# Patient Record
Sex: Female | Born: 1960 | Race: Black or African American | Hispanic: No | Marital: Single | Smoking: Never smoker
Health system: Southern US, Community
[De-identification: ages and names within clinical notes are randomized; demographics above are authoritative.]

## PROBLEM LIST (undated history)

## (undated) DIAGNOSIS — F419 Anxiety disorder, unspecified: Secondary | ICD-10-CM

## (undated) DIAGNOSIS — E7800 Pure hypercholesterolemia, unspecified: Secondary | ICD-10-CM

## (undated) DIAGNOSIS — Z87442 Personal history of urinary calculi: Secondary | ICD-10-CM

## (undated) DIAGNOSIS — F32A Depression, unspecified: Secondary | ICD-10-CM

## (undated) DIAGNOSIS — I10 Essential (primary) hypertension: Secondary | ICD-10-CM

## (undated) DIAGNOSIS — M1990 Unspecified osteoarthritis, unspecified site: Secondary | ICD-10-CM

## (undated) DIAGNOSIS — K219 Gastro-esophageal reflux disease without esophagitis: Secondary | ICD-10-CM

## (undated) DIAGNOSIS — F329 Major depressive disorder, single episode, unspecified: Secondary | ICD-10-CM

## (undated) HISTORY — DX: Gastro-esophageal reflux disease without esophagitis: K21.9

## (undated) HISTORY — DX: Depression, unspecified: F32.A

## (undated) HISTORY — DX: Anxiety disorder, unspecified: F41.9

## (undated) HISTORY — DX: Major depressive disorder, single episode, unspecified: F32.9

## (undated) HISTORY — PX: CARPAL TUNNEL RELEASE: SHX101

## (undated) HISTORY — PX: NECK SURGERY: SHX720

## (undated) HISTORY — DX: Unspecified osteoarthritis, unspecified site: M19.90

## (undated) HISTORY — PX: OTHER SURGICAL HISTORY: SHX169

## (undated) HISTORY — PX: EYE SURGERY: SHX253

## (undated) HISTORY — PX: ABDOMINAL HYSTERECTOMY: SHX81

---

## 2001-03-03 ENCOUNTER — Ambulatory Visit (HOSPITAL_COMMUNITY): Admission: RE | Admit: 2001-03-03 | Discharge: 2001-03-03 | Payer: Self-pay | Admitting: Orthopedic Surgery

## 2002-10-14 ENCOUNTER — Encounter: Admission: RE | Admit: 2002-10-14 | Discharge: 2003-01-12 | Payer: Self-pay

## 2003-02-24 ENCOUNTER — Encounter: Admission: RE | Admit: 2003-02-24 | Discharge: 2003-05-25 | Payer: Self-pay | Admitting: Anesthesiology

## 2004-01-03 ENCOUNTER — Ambulatory Visit (HOSPITAL_COMMUNITY): Admission: RE | Admit: 2004-01-03 | Discharge: 2004-01-03 | Payer: Self-pay | Admitting: Internal Medicine

## 2005-12-29 ENCOUNTER — Encounter: Admission: RE | Admit: 2005-12-29 | Discharge: 2005-12-29 | Payer: Self-pay | Admitting: Orthopaedic Surgery

## 2006-04-19 ENCOUNTER — Encounter
Admission: RE | Admit: 2006-04-19 | Discharge: 2006-07-18 | Payer: Self-pay | Admitting: Physical Medicine & Rehabilitation

## 2006-04-19 ENCOUNTER — Ambulatory Visit: Payer: Self-pay | Admitting: Physical Medicine & Rehabilitation

## 2006-05-20 ENCOUNTER — Ambulatory Visit: Payer: Self-pay | Admitting: Orthopedic Surgery

## 2006-05-21 ENCOUNTER — Ambulatory Visit: Payer: Self-pay | Admitting: Physical Medicine & Rehabilitation

## 2007-03-26 ENCOUNTER — Ambulatory Visit: Payer: Self-pay | Admitting: Orthopedic Surgery

## 2007-09-22 ENCOUNTER — Encounter: Admission: RE | Admit: 2007-09-22 | Discharge: 2007-09-22 | Payer: Self-pay | Admitting: Orthopaedic Surgery

## 2007-11-28 ENCOUNTER — Encounter: Admission: RE | Admit: 2007-11-28 | Discharge: 2007-11-28 | Payer: Self-pay | Admitting: Orthopaedic Surgery

## 2010-08-15 NOTE — Progress Notes (Addendum)
Summary: progress note  rogress note   Imported By: Jacklynn Ganong 05/29/2010 09:52:45  _____________________________________________________________________  External Attachment:    Type:   Image     Comment:   External Document

## 2010-08-15 NOTE — Progress Notes (Signed)
Summary: Initial evaluation  Initial evaluation   Imported By: Jacklynn Ganong 05/29/2010 09:49:35  _____________________________________________________________________  External Attachment:    Type:   Image     Comment:   External Document

## 2010-10-30 ENCOUNTER — Encounter: Payer: Self-pay | Admitting: Orthopedic Surgery

## 2010-11-03 ENCOUNTER — Encounter: Payer: Self-pay | Admitting: Orthopedic Surgery

## 2010-11-16 ENCOUNTER — Encounter: Payer: Self-pay | Admitting: Orthopedic Surgery

## 2010-11-16 ENCOUNTER — Ambulatory Visit (INDEPENDENT_AMBULATORY_CARE_PROVIDER_SITE_OTHER): Payer: Medicare Other | Admitting: Orthopedic Surgery

## 2010-11-16 VITALS — HR 70 | Resp 16 | Ht 63.0 in | Wt 149.0 lb

## 2010-11-16 DIAGNOSIS — M67439 Ganglion, unspecified wrist: Secondary | ICD-10-CM

## 2010-11-16 DIAGNOSIS — M6740 Ganglion, unspecified site: Secondary | ICD-10-CM

## 2010-11-16 NOTE — Progress Notes (Signed)
History  This is a 50 year old female, who presents with a dorsal wrist mass consistent with a ganglion with pain in the wrist joint and into the forearm. She noticed a ganglion after the pain started in her wrist. This radiates up into the distal portion of the forearm. The mass is flat seems to be multilobulated.  Her symptoms include sharp throbbing stabbing pain which is 6/10 it is constant and is associated with using her wrist her hand a lot.  Symptoms started some months ago but seemed to come on suddenly  She is ALLERGIC to Levaquin and Percocet  She's had a RIGHT carpal tunnel LEFT carpal tunnel release and a repeat LEFT carpal tunnel release.  She's had a cervical fusion.  She's had a hysterectomy.  She's had kidney stones 3 times.  She is a family history lung disease asthma and arthritis  She is single currently disabled.    Physical exam    General: The patient is normally developed, with normal grooming and hygiene. There are no gross deformities. The body habitus is normal   CDV: The pulse and perfusion of the extremities are normal   LYMPH: There is no gross lymphadenopathy in the extremities   Skin: There are no rashes, ulcers or cafe-au-lait spot   Psyche: The patient is alert, awake and oriented.  Mood is normal   Neuro:  The coordination and balance are normal.  Sensation is normal. Reflexes are 2+ and equal   Musculoskeletal  Left wrist mass flat, tender at the dorsal wrist joint and forearm. Wrist extension is 20 degrees The wrist is stable  There is weakness in flexion and extension    X-rays 3 views of the wrist joint:   Diagnosis: Dorsal wrist ganglion with wrist joint pain.  Recommend referral to a hand specialist, for excision/evaluation

## 2010-11-30 ENCOUNTER — Telehealth: Payer: Self-pay | Admitting: Radiology

## 2010-11-30 NOTE — Telephone Encounter (Signed)
I sent a referral for this patient to Dr. Amanda Pea to be seen for a ganglion cyst on her left wrist.

## 2010-12-01 NOTE — Assessment & Plan Note (Signed)
Amber Cunningham is back regarding her upper extremity pain, right slightly greater  than left.  We initiated Lyrica at last visit, titrating up to 75 mg t.i.d.  This seems to have helped somewhat, but she is still having stabbing,  tingling and aching pain in the hands and arms.  She also complains of  shoulder pain and also neck pain on the right.  She saw an orthopedic  surgeon, Dr. Romeo Apple, in [place], who felt that she would not benefit  from any further surgical intervention regarding her carpal tunnel problems.  She began a trial of Mobic 15 mg daily of which she is unsure if she is  seeing benefit.  The patient has tried some desensitization exercises at  home which she has been taught previously by therapy, and these seem to help  somewhat.  Sleep is poor.  Pain interferes with general activity, relations  with others and enjoyment of life at a moderate-to-severe level.  Pain  improves with rest, ice and somewhat heat and medications.  Generally,  activity worsens pain.   REVIEW OF SYSTEMS:  Positive for numbness, tingling, weakness, confusion,  depression, weight gain, constipation, abdominal pain and poor appetite.  The pertinent positives are listed above in full review in the health and  history section.   SOCIAL HISTORY:  The patient is single and is living with her son.   PHYSICAL EXAMINATION:  Blood pressure 118/52, pulse 93, respiratory rate 16,  oxygen saturation 98% on room air.  The patient is pleasant, alert and  oriented x3.  Affect is bright and appropriate.  Gait is stable.  Coordination in the upper extremities is decreased throughout the hands.  She continues to have tenderness in the left hand along the median nerve as  well as the radial in the sensory nerve distribution.  There is pain over  the operative site.  Right hand symptoms are similar except for the radial  nerve signs.  She had very preserved strength.  She had a positive  Finkelstein test.  Tinel test  was positive as was Phalen test bilaterally.  Strength is generally 4+ to 5/5 throughout both upper extremities.  She did  have right rotator cuff signs today as well as some tenderness in the  bicipital tendons.  Spurling's test was equivocal.  She did have some  tenderness in the upper trapezius muscle specifically today.  Cognitively,  she was appropriate.  Heart was regular rate.  Chest was clear.  Abdomen was  soft and nontender.   ASSESSMENT:  1. History of multiple carpal tunnel surgeries with left carpal tunnel      release approximately seven months ago.  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  There also appears to be radial sensory nerve      involvement in the left wrist.  Consider a complex regional pain      syndrome, type 2.  2. De Quervain's tenosynovitis.  3. Chronic pain of cervical spine.  4. Right rotator cuff syndrome.  5. Anxiety/depression disorder.  6. History of marijuana use.   PLAN:  1. Continue titrating Lyrica up, ultimately to 150 mg t.i.d.  2. Continue Mobic 15 mg daily.  3. After informed consent, we injected the right shoulder via posterior      approach with 3 cc 1% lidocaine and 40 mg Kenalog.  4. Urine drug  screen today was performed to assess for illegal drug use.      The patient is aware of the consequences of a positive test.  5. Consider revisiting therapy for the wrist, although the patient does      know some of her desensitization exercises already.  6. Add Pamelor 25 mg 1-2 nightly for sleep and neuropathic pain.  7. I will see the patient back in one month's time.      Ranelle Oyster, M.D.  Electronically Signed     ZTS/MedQ  D:  05/21/2006 15:49:09  T:  05/22/2006 08:46:03  Job #:  [id number]   cc:   Grayland Jack  Fax: [phone]

## 2010-12-01 NOTE — Consult Note (Signed)
NAME:  SHAQUANDRA, GALANO NO.:  [id number]   MEDICAL RECORD NO.:  [id number]                   PATIENT TYPE:  REC   LOCATION:                                       FACILITY:   PHYSICIAN:  Celene Kras, MD                     DATE OF BIRTH:  04/05/1961   DATE OF CONSULTATION:  05/11/2003  DATE OF DISCHARGE:                                   CONSULTATION   Yumi Insalaco [facility]. Evaluated her health  and history form and 14-point Review of Systems.   1. Lenea comes to [place] today and has had evaluation by [facility]  He has     considered her for surgery of the cervical spine and is also evaluating     her lumbar spine.  2. I reviewed the MRI of both cervical and lumbar; she has degenerative     components in lower lumbar segments, particularly 4-5 with a dark disk.     She has a facetal overlay.  Cervical MRI is not particularly evolved.  3. Lifestyle enhancements reviewed.  Home-based therapy discussed.  4. Consider TENS.  5. She also has incidental finding that has revealed some type of spot on     her liver.  This is being evaluated by surgery.    OBJECTIVE:  She has diffuse suprascapular paracervical and paralumbar  myofascial discomfort, impaired flexion and extension of the cervical and  lumbar spines.  Pain over posterior superior iliac spine, notable pain on  extension.  I really do not find any new neurological findings motor,  sensory, reflexes.   IMPRESSION:  Cervicalgia, degenerative spine disease, cervical spine,  cervical facet syndrome.   PLAN:  1. Will go ahead and wait to see her in followup.  I would like her to have     her liver cyst evaluated and further assessment by Dr. Tyrone Sage.  Dr.     Tyrone Sage is contemplating surgery.  Defer to him in this arena.  2. I would consider a lumbar epidural.  The cervical procedures have helped     her somewhat.  The rationale is to minimize escalation of  narcotic-based     pain medication.  3. She is using hydrocodone sparingly and appropriately,  and since august     has really only used shy of 100 tablets, and I reviewed this medication     with her.  I will go ahead and renew during the workup of her liver, and     further discission making as to controlled substance use will be made     based on a multimodality approach to treating her pain.  Possibly a     lumbar epidural would be very helpful here.   We will see her in followup.  Discussed with her.  She understands these  medications, overall plan.  No  barriers to communication.                                               Celene Kras, MD    HH/MEDQ  D:  05/11/2003  T:  05/11/2003  Job:  [id number]   cc:   Dr. Tyrone Sage  [facility]  [place]

## 2010-12-01 NOTE — Consult Note (Signed)
NAME:  Amber Cunningham, Amber Cunningham                         ACCOUNT NO.:  [id number]   MEDICAL RECORD NO.:  [id number]                   PATIENT TYPE:  REC   LOCATION:  [facility]                                  FACILITY:  [facility]   PHYSICIAN:  Zachary George, DO                      DATE OF BIRTH:  Mar 22, 1961   DATE OF CONSULTATION:  10/15/2002  DATE OF DISCHARGE:                                   CONSULTATION   Dear Dr. Mila Palmer:   Thank you very much for kindly referring this patient to the center for pain  and rehabilitative medicine for evaluation.  The patient was seen in our  clinic today.  Please refer to the following for details regarding the  History and Physical examination and treatment recommendations.  Once again,  thank you for allowing [place] to participate in the care of this patient.   CHIEF COMPLAINT:  Neck, shoulder, hands, and wrist pain.   HISTORY OF PRESENT ILLNESS:  The patient is a pleasant 50 year old, right-  hand dominant female who complains of a six-month history of pain in her  left anterior chest wall radiating to her left shoulder and neck and into  her upper back bilaterally with pain in her forearms with associated  numbness and paresthesias. She has a history of bilateral carpal tunnel  syndrome for greater than one year and is status post right carpal tunnel  release x 2.  Her workup today has included cardiac workup which, according  to her report, is negative.  I do not have records in this regard.   She has had an MRI of her cervical spine on 10/23/2001 which revealed  multilevel spondylosis with leftward foraminal narrowing at C5-6 and C6-7 as  well as C7-T1.  There is mild central canal stenosis at C5-6 secondary to  spondylosis.  There is no lateralizing cervical disk protrusion or extrusion  noted.  She has been evaluated by a neurosurgeon at [facility] in  [place] who did not feel like she was a surgical candidate and felt  like her pain was coming from  her left shoulder.  The patient has undergone  left shoulder injection without any relief.  She has not had any physical  therapy for her current symptoms.  She has undergone nerve conduction  studies and EMG on two separate occasions revealing left carpal tunnel  syndrome and resolved right carpal tunnel syndrome postoperatively.   Her pain is rated as 5/10 on subjective scale and described as achy,  throbbing, burning with associated numbness, tingling, and weakness.  Her  symptoms are worse with working and improved with medications to some  degree.  She currently takes Relafen 750 mg 1 p.o. b.i.d. which she states  helps a little. She also takes Talwin 1 every 4 hours as needed.  She takes  this very sparingly.  In fact, she brings her  bottles of pills with her  today and has 8 pills out of 60 remaining from a prescription written on  09/08/2002.  She really does not like taking medications.  She did have some  blood work checked at some point but cannot remember exactly what it was,  and I do not have results or records in this regard.   I reviewed the health and history form and 14-point Review of Systems.  Function and quality of life indices have declined.  Her sleep has been  poor, but she states it is better now after starting to take Clonazepam  regularly every evening which has been prescribed for panic disorder and  depression.  She also takes fluoxetine.  The patient denies any fevers,  chills, night sweats, or weight loss.  Denies any bowel or bladder  dysfunction.  She is treated by mental health for her depression and  anxiety.   PAST MEDICAL HISTORY:  1. Kidney stones.  2. Panic attack.  3. Depression.   PAST SURGICAL HISTORY:  1. Carpal tunnel surgery August 2002 and November 2002.  2. Exploratory laparoscopy in 2002 in left lower quadrant.  3. Tubal ligation.   FAMILY HISTORY:  Noncontributory.   SOCIAL HISTORY:  The patient denies smoking or alcohol use.  She  states she  used marijuana approximately one month ago but denies regular use.  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   ALLERGIES:  No known drug allergies.   MEDICATIONS:  Clonazepam, fluoxetine, Relafen, and Talwin.   PHYSICAL EXAMINATION:  GENERAL:  Healthy-appearing female in no acute  distress.  Mood and affect are appropriate.  The patient is alert and  oriented.  VITAL SIGNS:  Blood pressure 121/57, pulse 82, respirations 16, O2  saturation 99% on room air.  HEART:  Regular rate and rhythm without gallops, murmurs, or rubs.  LUNGS:  Clear to auscultation bilaterally.  EXTREMITIES:  There is no heat, erythema, or edema in the upper and lower  extremities.  MUSCULOSKELETAL:  Flattened thoracic kyphosis with normal cervical lordosis.  She has full range of motion of the cervical spine with a pulling sensation  in her left upper back with right side bending.  She has full range of  motion of the shoulders bilaterally.  Palpatory examination reveals trigger  points in the left upper trapezius, levator, scapulae, infraspinatus,  suboccipital muscles, and pectoralis muscles on the left.  She also has  significant tenderness to palpation over the left biceps tendon.  Provocative maneuvers of the shoulder reveal negative Hawkins, Neer, empty  can, and O'Brien tests bilaterally, but these maneuvers tend to increase  patient's periscapular and upper trapezius discomfort.  NEUROLOGIC:  Manual muscle testing is 5/5 bilateral upper and lower  extremities.  Sensory examination is intact to light touch bilateral upper  and lower extremities at this time.  Muscle stretch reflexes are 2+/4  bilateral biceps, triceps, brachial radialis, pronator teres, patellar, hamstrings, and Achilles.  Tinel's test is positive bilaterally at the  wrists over the median nerves,  right greater than left.  Spurling maneuver  is negative bilaterally.   IMPRESSION:  1. Myofascial pain syndrome with above-noted trigger points.  2. Cervicalgia, multifactorial with myofascial component as well as cervical     spondylosis which may be contributing as well.  3. Left biceps tendinitis.  4.  History of depression.   PLAN:  1. Discussed treatment options with the patient.  I would like to get her     started into physical therapy for range of motion, stretching, cervical     and scapular stabilization exercises, as well as general cervical     traction and modalities as needed home exercise program three times per     week for four weeks.  2. Will start Lidoderm 5% apply up to 12 hours per day, maximum 3 patches at     a time, #60 without refills.  She may cut the Lidoderm and use a small     strip across her wrists if desired.  3. Continue Relafen for now.  4. Consider Ultram for primary pain control.  Would use this with some     caution secondary to being on an SSRI medication as there may be     potential interaction and lowering of seizure threshold, although the     patient does not have a history of seizures, and I feel this would be     very rare.  5. The patient is to follow up with Dr. Mila Palmer.  6. The patient will return to clinic on an as-needed basis.  If symptoms are     not improving, would consider trigger point injections.  If she got no     relief with the trigger point injections, would consider cervical facet     injections versus cervical epidural injections.   The patient was educated about findings and recommendations and understands.  No barriers to communication.                                                Zachary George, DO    JW/MEDQ  D:  10/15/2002  T:  10/16/2002  Job:  [id number]   cc:   Janean Sark, M.D.  [address]  [place], [place] [address]

## 2010-12-01 NOTE — Consult Note (Signed)
   NAME:  Amber Cunningham, Amber Cunningham NO.:  [id number]   MEDICAL RECORD NO.:  [id number]                   PATIENT TYPE:  REC   LOCATION:                                       FACILITY:  [facility]   PHYSICIAN:  Celene Kras, MD                     DATE OF BIRTH:  [birth year]   DATE OF CONSULTATION:  03/02/2003  DATE OF DISCHARGE:                                   CONSULTATION   Amber Cunningham comes to pain management center today for evaluation.  I  reviewed her history form and 14-point Review of Systems.   1. Amber Cunningham comes in today with progressive and problematic cervicalgia.  I     really do not see anything changing neurologically, but the cervical     epidurals only offered about two to three weeks relief, and she is still     impaired with range of motion.  2. I do not think further imaging or diagnostics are warranted.  I do,     however, think she needs a neurosurgical assessment.  I will send her to     Dr. Tyrone Sage for this.  3. I am going to increase her analgesic capacity.  I also cautioned her     about mixing Diazepam and Klonopin, and she understands.  She will stick     with Klonopin.  4. We discussed home-based therapy which will enhance overall outcome.  5. If Dr. Tyrone Sage does not take a surgical position, would contemplate moving     forward with facetal injection as she lateralizes her symptoms, with     option as to radiofrequency neural ablation with positive provacative     block.   Objectively, diffuse paracervical myofascia discomfort, impaired flexion,  extension, and lateral rotation.  Pain positive cervical facetal compression  test, right greater than left.  Suprascapular levator scapula myofascial  pain as well.  She has no new neurological findings.  Normal sensory  function.   IMPRESSION:  1. Cervicalgia.  2. Degenerative spinal disease of cervical spine.  3. Cervical facet syndrome.   PLAN:  1. Follow up in three to four weeks after  she has an opportunity to be     reviewed by Dr. Tyrone Sage .  2. Consider further interventional procedures as we move down our decision     tree.  3. Will go ahead and prescribe hydrocodone analgesic capacity with full     disclosure.  Discharge instructions given.                                               Celene Kras, MD    HH/MEDQ  D:  03/02/2003  T:  03/02/2003  Job:  [id number]

## 2010-12-01 NOTE — Op Note (Signed)
   NAME:  Amber Cunningham, Amber Cunningham NO.:  [id number]   MEDICAL RECORD NO.:  [id number]                   PATIENT TYPE:  REC   LOCATION:  [facility]                                  FACILITY:  [facility]   PHYSICIAN:  Celene Kras, MD                     DATE OF BIRTH:  09-23-1960   DATE OF PROCEDURE:  01/05/2003  DATE OF DISCHARGE:                                 OPERATIVE REPORT   The patient comes into pain management today to evaluate review health and  history form and 14-point review of systems.   Originally scheduled for a cervical facetal injection. I think we have seen  significant benefit with cervical epidural. I think a second one is  warranted.  Then I would like to follow her up and see how she is doing. I  do not necessarily plan another injection, but I would like to see her in  follow-up, and I reviewed risks, complications, and options of this  procedure.   I do not believe further imaging or diagnostic are warranted.   Lifestyle enhancements were reviewed.   Review of medications, nothing is added here today.   Her symptoms have primarily migrated to a myofascial position, suprascapular  and levator scapular positioning.  He does have a facetal overlay and I  would consider doing a facet injection with possible neurotomy for prolonged  or recycling, but I think we are seeing the most benefit from a broad  brushstroke perspective with the cervical epidural.   OBJECTIVE:  Diffuse paracervical myofascial discomfort, impaired flexion,  extension, and lateral rotational pain, positive cervical facetal  compression test right greater than left. She has no new neurological  findings.  Motor, sensory, or reflexes.   IMPRESSION:  Cervicalgia, degenerative spine disease of the cervical spine.   PLAN:  Cervical epidural. She has consented.   DESCRIPTION OF PROCEDURE:  The patient was taken to the fluoroscopy suite  and placed in the upright position. The neck  was prepped and draped in the  usual sterile fashion.  Using a Hustead needle, I advanced to the C7-T1  interspace without any evidence of CSF, heme, or paresthesias.  Test block  uneventfully followed with 40 mg of Versed to flush the needle.   Tolerated the procedure well with no complications to our procedure.  Discharge instructions given.  Appropriate recovery.  No complications  identified.                                               Celene Kras, MD    HH/MEDQ  D:  01/05/2003  T:  01/06/2003  Job:  [id number]

## 2010-12-01 NOTE — Op Note (Signed)
   NAME:  Amber Cunningham, HORNUNG NO.:  [id number]   MEDICAL RECORD NO.:  [id number]                   PATIENT TYPE:  REC   LOCATION:  [facility]                                  FACILITY:  [facility]   PHYSICIAN:  Celene Kras, MD                     DATE OF BIRTH:  1961/05/21   DATE OF PROCEDURE:  DATE OF DISCHARGE:                                 OPERATIVE REPORT   CLINICAL NOTE:  [facility]  I  evaluated her via health and history form and 14-point review of systems.   1. I reviewed the chart, reviewed available imaging.  I reviewed the MRI and     request from Dr. Regino Schultze.  We are going to go ahead and proceed today     with cervical epidural.  She had a modest result with minimally-invasive     approach, trigger point injection, conservative management, and is still     remaining out of work.  She is contemplating disability.  I encouraged     her to remain enabled, and rationale for performing the injection is to     minimize escalation of narcotic-based pain medication and further support     enabling behavior.  2. Do not believe further imaging or diagnostics are warranted.  3. Review of medications, she is appropriate.  Review of home-based therapy.   OBJECTIVE:  Diffuse paracervical myofascial discomfort.  Impaired flexion  and extension, lateral rotational pain.  Positive cervical facetal  compression test, right greater than left.  She has no new neurological  findings motor, sensory, or reflexes.   IMPRESSION:  1. Cervicalgia.  2. Degenerative spine disease of the cervical spine.   PLAN:  Cervical epidural.  She is consented.   DESCRIPTION OF PROCEDURE:  The patient is taken to the procedure area,  placed in the upright position, appropriate IV access and monitors were  placed.  Versed 2 mg sedation, appropriate recovery.   The patient is prepped and draped in the usual fashion.  Using a Hustead  needle under  local anesthetic, I advanced to the C7-T1 interspace without  any evidence of CSF, heme, or paresthesia.  I used hanging drop test  technique.  I then test block uneventfully and follow with 40 mg of  Aristocort and flush the needle.   She tolerated this procedure well, no complication from our procedure.  Discharge instructions given.  Will see her in follow-up.                                               Celene Kras, MD    HH/MEDQ  D:  12/15/2002  T:  12/15/2002  Job:  [id number]

## 2010-12-01 NOTE — Group Therapy Note (Signed)
CHIEF COMPLAINT:  Bilateral hand and wrist pain.   HISTORY OF PRESENT ILLNESS:  This is a 50 year old African American female  who was in our office three or four years ago for cervical pain.  She is  presenting today for bilateral hand pain.  She has had problems with carpal  tunnel syndrome in both upper extremities.  She had her most recent carpal  tunnel release in March of 2007 in the left with a dorsal lateral release as  well with deQuervain's tenosynovitis.  Since the surgery, her left hand has  been worse and in fact worse on the right hand.  She has had three release  type surgeries on the right wrist and she does not recall how long ago that  last right surgery was.  Perhaps it was a year to a year and a half ago.  She has had nerve conduction studies in the past which concurred with carpal  tunnel syndrome.  None of those are available to me.  It seems to be a few  years ago when they were done.  The patient states that her hands and wrists  were most painful during the day when she is active and lifting.  She has  problems with folding clothes and driving.  She also has pain at night.  She  gets about four hours of sleep.  She had been on Lyrica 75 mg q.h.s. which  has helped somewhat but she stopped when her prescription ran out.  She  noted some weight gain with this.  Pain is described as tingling, constant,  aching and sharp.  She notes some occasional swelling in the wrist as well  as temperature change and color change, particularly with activity.  Dorsally on the right and left hand, the pain seems to cover the whole palm  and wrist.  She has more central wrist pain on the left.  On the right side,  the pain is more diffuse, particularly at the dorsal aspect.  The volar  aspect seems to be more related to the surgical site as well.  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  She has not been  back at work since then.  She is not taking much in  the way of pain and just  using PM at nighttime to help her sleep.   REVIEW OF SYSTEMS:  Positive for numbness, confusion, depression, anxiety,  weakness, weight gain, diarrhea, constipation, poor appetite, limb swelling.  Full review as in the health and history section and other pertinent  positives listed above.  The patient has been seen by Dr. Noel Gerold for neck and  cervical pain and has had prior neck surgery in the past.   PAST MEDICAL HISTORY:  1. Positive for diverticulitis.  2. Arthritis as mentioned above.  3. Carpal tunnel syndrome.  4. Depression/anxiety.  5. Nephrolithiasis.  6. Hysterectomy in 2001.  7. She had kidney stones eradicated on three separate occasions.  8. Neck surgery with fixation in 2001 by Dr. Tyrone Sage.   SOCIAL HISTORY:  The patient is single, living with her son.  She has used  marijuana within the last few days but states that she only uses it rarely.   PHYSICAL EXAMINATION:  Blood pressure 116/57, pulse is 79, respiratory rate  is 16.  She is saturating 97% on room air.  The patient is generally  pleasant, in no acute distress.  She is a bit anxious.  Gait is stable.  Coordination was fair.  Reflexes were 1+ in the upper extremities.  Sensation was decreased to fine touch and pinprick over both hands,  particularly in the median nerve distribution.  There seemed to be a radial  nerve component in both extremities today.  There may have been some ulnar  involvement on the right side, although this was inconsistent.  The patient  had intact finger movement.  She had a tight grip with OK maneuver.  Her  Finkelstein's tests was positive on both hands.  The Tinel's test was  positive as was Phalen's test in both hands today.  She had 4/5 strength at  the elbows with intact range of motion.  Shoulder strength was 5/5.  Cognitively, she was appropriate.  Cranial nerve exam was intact.  Lower  extremity exam was 5/5 for strength with good coordination and  mobility.  Reflexes 2+ in the legs.  The patient was a bit anxious today but generally  appropriate.   On examination of the skin, the patient's surgical scars were noted.  She  had significant pain and tenderness over the dorsal with scar on the left.  Both median/carpal tunnel operative sites were tender with palpation today.  I would say that her left wrist was almost allodynic.  No temperature  changes were appreciated or color changes in the skin today.  Pulses were 2+  in both limbs.   ASSESSMENT:  1. History of multiple carpal tunnel surgeries with left carpal tunnel      release six months ago.  The patient with residual neuropathy.  Could      be developing complex regional pain syndrome, type 2.  Cannot rule out      neuroma.  2. DeQuervain's tenosynovitis.  3. Chronic pain predominantly in the cervical spine.  4. Anxiety/depression disorder.   PLAN:  1. As the patient had done pretty well with the Lyrica.  We will      reinitiate Lyrica at 75 mg q.h.s. increasing to t.i.d. over 10 days      time.  2. Begin Mobic trial 15 mg daily.  3. Need to get patient back into therapy to work on desensitization      exercises.  4. Might need to follow up nerve conduction studies.  5. The patient will have a urine drug screen performed at next visit.  She      is warned that if this test comes up positive for marijuana she will be      discharged from the clinic.  6. Consider triple phase bone scan.  7. We will see the patient back in one months' time.      Ranelle Oyster, M.D.  Electronically Signed     ZTS[facility]  D:  04/22/2006 16:21:25  T:  04/24/2006 00:37:18  Job #:  [id number]   cc:   [place] Jack  Fax: [phone]

## 2014-07-14 ENCOUNTER — Encounter (INDEPENDENT_AMBULATORY_CARE_PROVIDER_SITE_OTHER): Payer: Self-pay

## 2014-07-14 ENCOUNTER — Encounter (INDEPENDENT_AMBULATORY_CARE_PROVIDER_SITE_OTHER): Payer: Self-pay | Admitting: *Deleted

## 2014-08-03 ENCOUNTER — Ambulatory Visit: Payer: Medicare Other | Admitting: Orthopedic Surgery

## 2014-08-12 ENCOUNTER — Ambulatory Visit: Payer: Medicare Other | Admitting: Orthopedic Surgery

## 2014-08-25 ENCOUNTER — Encounter: Payer: Self-pay | Admitting: Orthopedic Surgery

## 2015-07-19 DIAGNOSIS — M79642 Pain in left hand: Secondary | ICD-10-CM | POA: Diagnosis not present

## 2015-07-19 DIAGNOSIS — M62542 Muscle wasting and atrophy, not elsewhere classified, left hand: Secondary | ICD-10-CM | POA: Diagnosis not present

## 2015-07-19 DIAGNOSIS — M25442 Effusion, left hand: Secondary | ICD-10-CM | POA: Diagnosis not present

## 2015-07-19 DIAGNOSIS — M25642 Stiffness of left hand, not elsewhere classified: Secondary | ICD-10-CM | POA: Diagnosis not present

## 2015-07-20 DIAGNOSIS — M65842 Other synovitis and tenosynovitis, left hand: Secondary | ICD-10-CM | POA: Diagnosis not present

## 2015-07-20 DIAGNOSIS — M67432 Ganglion, left wrist: Secondary | ICD-10-CM | POA: Diagnosis not present

## 2015-07-20 DIAGNOSIS — M25532 Pain in left wrist: Secondary | ICD-10-CM | POA: Diagnosis not present

## 2015-07-20 DIAGNOSIS — Z4789 Encounter for other orthopedic aftercare: Secondary | ICD-10-CM | POA: Diagnosis not present

## 2015-09-05 DIAGNOSIS — M65842 Other synovitis and tenosynovitis, left hand: Secondary | ICD-10-CM | POA: Diagnosis not present

## 2015-09-05 DIAGNOSIS — M1811 Unilateral primary osteoarthritis of first carpometacarpal joint, right hand: Secondary | ICD-10-CM | POA: Diagnosis not present

## 2015-09-05 DIAGNOSIS — M67432 Ganglion, left wrist: Secondary | ICD-10-CM | POA: Diagnosis not present

## 2015-09-05 DIAGNOSIS — M25532 Pain in left wrist: Secondary | ICD-10-CM | POA: Diagnosis not present

## 2015-10-05 DIAGNOSIS — F331 Major depressive disorder, recurrent, moderate: Secondary | ICD-10-CM | POA: Diagnosis not present

## 2015-10-11 DIAGNOSIS — E2839 Other primary ovarian failure: Secondary | ICD-10-CM | POA: Diagnosis not present

## 2015-12-06 DIAGNOSIS — F331 Major depressive disorder, recurrent, moderate: Secondary | ICD-10-CM | POA: Diagnosis not present

## 2015-12-09 DIAGNOSIS — M25561 Pain in right knee: Secondary | ICD-10-CM | POA: Diagnosis not present

## 2016-02-28 DIAGNOSIS — F331 Major depressive disorder, recurrent, moderate: Secondary | ICD-10-CM | POA: Diagnosis not present

## 2016-03-28 DIAGNOSIS — F331 Major depressive disorder, recurrent, moderate: Secondary | ICD-10-CM | POA: Diagnosis not present

## 2016-05-18 DIAGNOSIS — Z23 Encounter for immunization: Secondary | ICD-10-CM | POA: Diagnosis not present

## 2016-06-12 DIAGNOSIS — Z299 Encounter for prophylactic measures, unspecified: Secondary | ICD-10-CM | POA: Diagnosis not present

## 2016-06-12 DIAGNOSIS — E7800 Pure hypercholesterolemia, unspecified: Secondary | ICD-10-CM | POA: Diagnosis not present

## 2016-06-12 DIAGNOSIS — Z79899 Other long term (current) drug therapy: Secondary | ICD-10-CM | POA: Diagnosis not present

## 2016-06-12 DIAGNOSIS — F419 Anxiety disorder, unspecified: Secondary | ICD-10-CM | POA: Diagnosis not present

## 2016-06-12 DIAGNOSIS — Z7189 Other specified counseling: Secondary | ICD-10-CM | POA: Diagnosis not present

## 2016-06-12 DIAGNOSIS — Z1389 Encounter for screening for other disorder: Secondary | ICD-10-CM | POA: Diagnosis not present

## 2016-06-12 DIAGNOSIS — Z0000 Encounter for general adult medical examination without abnormal findings: Secondary | ICD-10-CM | POA: Diagnosis not present

## 2016-06-12 DIAGNOSIS — Z1211 Encounter for screening for malignant neoplasm of colon: Secondary | ICD-10-CM | POA: Diagnosis not present

## 2016-06-12 DIAGNOSIS — Z6823 Body mass index (BMI) 23.0-23.9, adult: Secondary | ICD-10-CM | POA: Diagnosis not present

## 2016-06-27 DIAGNOSIS — F419 Anxiety disorder, unspecified: Secondary | ICD-10-CM | POA: Diagnosis not present

## 2016-07-18 DIAGNOSIS — Z713 Dietary counseling and surveillance: Secondary | ICD-10-CM | POA: Diagnosis not present

## 2016-07-18 DIAGNOSIS — Z6823 Body mass index (BMI) 23.0-23.9, adult: Secondary | ICD-10-CM | POA: Diagnosis not present

## 2016-07-18 DIAGNOSIS — Z299 Encounter for prophylactic measures, unspecified: Secondary | ICD-10-CM | POA: Diagnosis not present

## 2016-07-18 DIAGNOSIS — Z789 Other specified health status: Secondary | ICD-10-CM | POA: Diagnosis not present

## 2016-07-18 DIAGNOSIS — J069 Acute upper respiratory infection, unspecified: Secondary | ICD-10-CM | POA: Diagnosis not present

## 2016-09-17 DIAGNOSIS — F331 Major depressive disorder, recurrent, moderate: Secondary | ICD-10-CM | POA: Diagnosis not present

## 2016-12-03 DIAGNOSIS — E7800 Pure hypercholesterolemia, unspecified: Secondary | ICD-10-CM | POA: Diagnosis not present

## 2016-12-03 DIAGNOSIS — Z299 Encounter for prophylactic measures, unspecified: Secondary | ICD-10-CM | POA: Diagnosis not present

## 2016-12-03 DIAGNOSIS — Z6823 Body mass index (BMI) 23.0-23.9, adult: Secondary | ICD-10-CM | POA: Diagnosis not present

## 2016-12-03 DIAGNOSIS — Z713 Dietary counseling and surveillance: Secondary | ICD-10-CM | POA: Diagnosis not present

## 2016-12-03 DIAGNOSIS — M25561 Pain in right knee: Secondary | ICD-10-CM | POA: Diagnosis not present

## 2016-12-04 DIAGNOSIS — M25561 Pain in right knee: Secondary | ICD-10-CM | POA: Diagnosis not present

## 2016-12-06 DIAGNOSIS — F331 Major depressive disorder, recurrent, moderate: Secondary | ICD-10-CM | POA: Diagnosis not present

## 2016-12-17 DIAGNOSIS — Z6823 Body mass index (BMI) 23.0-23.9, adult: Secondary | ICD-10-CM | POA: Diagnosis not present

## 2016-12-17 DIAGNOSIS — K219 Gastro-esophageal reflux disease without esophagitis: Secondary | ICD-10-CM | POA: Diagnosis not present

## 2016-12-17 DIAGNOSIS — E7800 Pure hypercholesterolemia, unspecified: Secondary | ICD-10-CM | POA: Diagnosis not present

## 2016-12-17 DIAGNOSIS — F329 Major depressive disorder, single episode, unspecified: Secondary | ICD-10-CM | POA: Diagnosis not present

## 2016-12-17 DIAGNOSIS — F418 Other specified anxiety disorders: Secondary | ICD-10-CM | POA: Diagnosis not present

## 2016-12-17 DIAGNOSIS — Z299 Encounter for prophylactic measures, unspecified: Secondary | ICD-10-CM | POA: Diagnosis not present

## 2016-12-17 DIAGNOSIS — Z713 Dietary counseling and surveillance: Secondary | ICD-10-CM | POA: Diagnosis not present

## 2016-12-17 DIAGNOSIS — M25561 Pain in right knee: Secondary | ICD-10-CM | POA: Diagnosis not present

## 2016-12-17 DIAGNOSIS — K5730 Diverticulosis of large intestine without perforation or abscess without bleeding: Secondary | ICD-10-CM | POA: Diagnosis not present

## 2016-12-21 DIAGNOSIS — M25561 Pain in right knee: Secondary | ICD-10-CM | POA: Diagnosis not present

## 2016-12-24 DIAGNOSIS — M25561 Pain in right knee: Secondary | ICD-10-CM | POA: Diagnosis not present

## 2017-01-21 DIAGNOSIS — Z299 Encounter for prophylactic measures, unspecified: Secondary | ICD-10-CM | POA: Diagnosis not present

## 2017-01-21 DIAGNOSIS — Z713 Dietary counseling and surveillance: Secondary | ICD-10-CM | POA: Diagnosis not present

## 2017-01-21 DIAGNOSIS — F418 Other specified anxiety disorders: Secondary | ICD-10-CM | POA: Diagnosis not present

## 2017-01-21 DIAGNOSIS — E7800 Pure hypercholesterolemia, unspecified: Secondary | ICD-10-CM | POA: Diagnosis not present

## 2017-01-21 DIAGNOSIS — I10 Essential (primary) hypertension: Secondary | ICD-10-CM | POA: Diagnosis not present

## 2017-01-21 DIAGNOSIS — F329 Major depressive disorder, single episode, unspecified: Secondary | ICD-10-CM | POA: Diagnosis not present

## 2017-01-21 DIAGNOSIS — Z6823 Body mass index (BMI) 23.0-23.9, adult: Secondary | ICD-10-CM | POA: Diagnosis not present

## 2017-01-21 DIAGNOSIS — K5730 Diverticulosis of large intestine without perforation or abscess without bleeding: Secondary | ICD-10-CM | POA: Diagnosis not present

## 2017-01-21 DIAGNOSIS — M25561 Pain in right knee: Secondary | ICD-10-CM | POA: Diagnosis not present

## 2017-01-25 DIAGNOSIS — M94261 Chondromalacia, right knee: Secondary | ICD-10-CM | POA: Diagnosis not present

## 2017-01-25 DIAGNOSIS — M1711 Unilateral primary osteoarthritis, right knee: Secondary | ICD-10-CM | POA: Diagnosis not present

## 2017-01-25 DIAGNOSIS — M25461 Effusion, right knee: Secondary | ICD-10-CM | POA: Diagnosis not present

## 2017-01-25 DIAGNOSIS — M23351 Other meniscus derangements, posterior horn of lateral meniscus, right knee: Secondary | ICD-10-CM | POA: Diagnosis not present

## 2017-01-25 DIAGNOSIS — S83281A Other tear of lateral meniscus, current injury, right knee, initial encounter: Secondary | ICD-10-CM | POA: Diagnosis not present

## 2017-02-25 ENCOUNTER — Ambulatory Visit: Payer: Medicare Other | Admitting: Orthopedic Surgery

## 2017-02-26 ENCOUNTER — Encounter: Payer: Self-pay | Admitting: Orthopedic Surgery

## 2017-02-26 ENCOUNTER — Ambulatory Visit (INDEPENDENT_AMBULATORY_CARE_PROVIDER_SITE_OTHER): Payer: Medicare Other | Admitting: Orthopedic Surgery

## 2017-02-26 VITALS — BP 142/85 | HR 85 | Wt 146.0 lb

## 2017-02-26 DIAGNOSIS — M23300 Other meniscus derangements, unspecified lateral meniscus, right knee: Secondary | ICD-10-CM | POA: Diagnosis not present

## 2017-02-26 NOTE — Progress Notes (Signed)
Patient ID: Amber Cunningham, female   DOB: 12/15/1960, 56 y.o.   MRN: [id number]   Chief Complaint  Patient presents with  . Knee Injury    RIGHT    Amber Cunningham is a 56 y.o. female.   56 year old female had some mild pain in her left knee and then one day she extended her right knee and had acute pain and swelling. She eventually went for treatment and was found to have a torn meniscus and osteoarthritis all 3 compartments  She complains of moderate to severe dull aching pain across the front of her knee on the medial lateral side with associated swelling occasional mechanical symptoms of popping    Review of Systems Review of Systems  Constitutional: Negative for weight loss.  Skin: Negative for itching and rash.  Neurological: Negative for sensory change and focal weakness.    Past Medical History:  Diagnosis Date  . Anxiety   . Arthritis   . Depression   . Diabetes mellitus without complication ([place])   . GERD (gastroesophageal reflux disease)     Past Surgical History:  Procedure Laterality Date  . ABDOMINAL HYSTERECTOMY    . CARPAL TUNNEL RELEASE    . EYE SURGERY    . KIDNEY STONE EXTRACTION    . NECK SURGERY      Allergies  Allergen Reactions  . Levofloxacin   . Percocet [Oxycodone-Acetaminophen]     Current Outpatient Prescriptions  Medication Sig Dispense Refill  . Calcium Citrate-Vitamin D (CALCIUM + D PO) Take by mouth.    . clonazePAM (KLONOPIN) 0.5 MG tablet Take 0.5 mg by mouth 2 (two) times daily.    Marland Kitchen estradiol (ESTRACE) 1 MG tablet Take 1 mg by mouth daily.    . mirtazapine (REMERON) 30 MG tablet Take 30 mg by mouth at bedtime.    . Multiple Vitamin (MULTIVITAMIN) tablet Take 1 tablet by mouth daily.    Marland Kitchen omeprazole (PRILOSEC) 20 MG capsule Take 20 mg by mouth daily.    Marland Kitchen omeprazole (PRILOSEC) 40 MG capsule Take 40 mg by mouth daily.    . pravastatin (PRAVACHOL) 10 MG tablet Take 10 mg by mouth daily.    . valACYclovir (VALTREX) 500 MG tablet  Take 500 mg by mouth 2 (two) times daily.    Marland Kitchen venlafaxine XR (EFFEXOR-XR) 75 MG 24 hr capsule Take 150 mg by mouth daily with breakfast.     No current facility-administered medications for this visit.      Physical Exam BP (!) 142/85   Pulse 85   Wt 146 lb (66.2 kg)   BMI 25.86 kg/m  Physical Exam   The patient is well developed well nourished and well groomed.   Orientation to person place and time is normal   Mood is pleasant. Affect normal  Ambulatory status is remarkable for a limp in the involved extremity on the right         Right  Knee examination:  Inspection: Tenderness is noted over the medial joint line and severe lateral meniscus   ROM: Is limited by pain with a maximum flexion arc of 120  Stability: Collateral ligaments are stable, the Lachman test and anterior and posterior drawer tests are normal  We do palpate lateral and medial joint line tenderness and a positive McMurray's for lateral  meniscal tear   Motor exam: Grade 5 motor strength in the quadriceps musculature   Skin: Warm dry and intact over the right leg  Neuro: normal sensation   Vascular: 2+ DP pulse with normal color and no edema.   Currently the left lower extremity and knee examination revealed mild  tenderness medial and lateral no  swelling, full range of motion without contracture subluxation atrophy or tremor. Normal muscle tone no instability and the neurovascular status of the limb is normal.    Assessment and Plan:  IMAGING: I have read and interpret the xrays as follows: 3 compartment OA; MRI report lateral meniscus tear OA 3 compartments   Diagnosis and treatment:   Osteoarthritis of the knee  Torn lateral meniscus   This procedure has been fully reviewed with the patient and written informed consent has been obtained.   [facility]   Amber Abbott, MD 02/26/2017 2:36 PM

## 2017-02-26 NOTE — Patient Instructions (Signed)
You have decided to proceed with operative arthroscopy of the knee. You have decided not to continue with nonoperative measures such as but not limited to oral medication, weight loss, activity modification, physical therapy, bracing, or injection.  We will perform operative arthroscopy of the knee. Some of the risks associated with arthroscopic surgery of the knee include but are not limited to Bleeding Infection Swelling Stiffness Blood clot Pain  If you're not comfortable with these risks and would like to continue with nonoperative treatment please let Dr. Harrison know prior to your surgery. 

## 2017-03-05 ENCOUNTER — Ambulatory Visit: Payer: Medicare Other | Admitting: Orthopedic Surgery

## 2017-03-06 DIAGNOSIS — F331 Major depressive disorder, recurrent, moderate: Secondary | ICD-10-CM | POA: Diagnosis not present

## 2017-03-07 NOTE — Patient Instructions (Signed)
Amber Cunningham  03/07/2017     @PREFPERIOPPHARMACY @   Your procedure is scheduled on  03/14/2017 .  Report to [facility] at  830  A.M.  Call this number if you have problems the morning of surgery:  [phone]   Remember:  Do not eat food or drink liquids after midnight.  Take these medicines the morning of surgery with A SIP OF WATER  Klonopin, prilosec, effexor.   Do not wear jewelry, make-up or nail polish.  Do not wear lotions, powders, or perfumes, or deoderant.  Do not shave 48 hours prior to surgery.  Men may shave face and neck.  Do not bring valuables to the hospital.  [facility] is not responsible for any belongings or valuables.  Contacts, dentures or bridgework may not be worn into surgery.  Leave your suitcase in the car.  After surgery it may be brought to your room.  For patients admitted to the hospital, discharge time will be determined by your treatment team.  Patients discharged the day of surgery will not be allowed to drive home.   Name and phone number of your driver:   family Special instructions:  None  Please read over the following fact sheets that you were given. Anesthesia Post-op Instructions and Care and Recovery After Surgery      Knee Ligament Injury, Arthroscopy Arthroscopy is a surgical technique in which your health care provider examines your knee through a small, pencil-sized telescope (arthroscope). Often, repairs to injured ligaments can be done with instruments in the arthroscope. Arthroscopy is less invasive than open-knee surgery. Tell a health care provider about:  Any allergies you have.  All medicines you are taking, including vitamins, herbs, eye drops, creams, and over-the-counter medicines.  Any problems you or family members have had with anesthetic medicines.  Any blood disorders you have.  Any surgeries you have had.  Any medical conditions you have. What are the risks? Generally, this is a  safe procedure. However, as with any procedure, problems can occur. Possible problems include:  Infection.  Bleeding.  Stiffness.  What happens before the procedure?  Ask your health care provider about changing or stopping any regular medicines. Avoid taking aspirin or blood thinners as directed by your health care provider.  Do not eat or drink anything after midnight the night before surgery.  If you smoke, do not smoke for at least 2 weeks before your surgery.  Do not drink alcohol starting the day before your surgery.  Let your health care provider know if you develop a cold or any infection before your surgery.  Arrange for someone to drive you home after the surgery or after your hospital stay. Also arrange for someone to help you with activities during recovery. What happens during the procedure?  Small monitors will be put on your body. They are used to check your heart, blood pressure, and oxygen levels.  An IV access tube will be put into one of your veins. Medicine will be able to flow directly into your body through this IV tube.  You might be given a medicine to help you relax (sedative).  You will be given a medicine that makes you go to sleep (general anesthetic), and a breathing tube will be placed into your lungs during the procedure.  Several small incisions are made in your knee. Saline fluid is placed into one of the incisions to expand the knee and  clear away any blood in the knee.  Your health care provider will insert the arthroscope to examine the injured knee.  During arthroscopy, your health care provider may find a partial or complete tear in a ligament.  Tools can be inserted through the other incisions to repair the injured ligaments.  The incisions are then closed with absorbable stitches and covered with dressings. What happens after the procedure?  You will be taken to the recovery area where you will be monitored.  When you are awake,  stable, and taking fluids without problems, you will be allowed to go home. This information is not intended to replace advice given to you by your health care provider. Make sure you discuss any questions you have with your health care provider. Document Released: 06/29/2000 Document Revised: 12/08/2015 Document Reviewed: 02/11/2013 Elsevier Interactive Patient Education  2017 [place].  Arthroscopic Knee Ligament Repair, Care After This sheet gives you information about how to care for yourself after your procedure. Your health care provider may also give you more specific instructions. If you have problems or questions, contact your health care provider. What can I expect after the procedure? After the procedure, it is common to have:  Pain in your knee.  Bruising and swelling on your knee, calf, and ankle for 3-4 days.  Fatigue.  Follow these instructions at home: If you have a brace or immobilizer:  Wear the brace or immobilizer as told by your health care provider. Remove it only as told by your health care provider.  Loosen the splint or immobilizer if your toes tingle, become numb, or turn cold and blue.  Keep the brace or immobilizer clean. Bathing  Do not take baths, swim, or use a hot tub until your health care provider approves. Ask your health care provider if you can take showers.  Keep your bandage (dressing) dry until your health care provider says that it can be removed. Cover it and your brace or immobilizer with a watertight covering when you take a shower. Incision care  Follow instructions from your health care provider about how to take care of your incision. Make sure you: ? Wash your hands with soap and water before you change your bandage (dressing). If soap and water are not available, use hand sanitizer. ? Change your dressing as told by your health care provider. ? Leave stitches (sutures), skin glue, or adhesive strips in place. These skin closures  may need to stay in place for 2 weeks or longer. If adhesive strip edges start to loosen and curl up, you may trim the loose edges. Do not remove adhesive strips completely unless your health care provider tells you to do that.  Check your incision area every day for signs of infection. Check for: ? More redness, swelling, or pain. ? More fluid or blood. ? Warmth. ? Pus or a bad smell. Managing pain, stiffness, and swelling  If directed, put ice on the affected area. ? If you have a removable brace or immobilizer, remove it as told by your health care provider. ? Put ice in a plastic bag. ? Place a towel between your skin and the bag or between your brace or immobilizer and the bag. ? Leave the ice on for 20 minutes, 2-3 times a day.  Move your toes often to avoid stiffness and to lessen swelling.  Raise (elevate) the injured area above the level of your heart while you are sitting or lying down. Driving  Do not drive  until your health care provider approves. If you have a brace or immobilizer on your leg, ask your health care provider when it is safe for you to drive.  Do not drive or use heavy machinery while taking prescription pain medicine. Activity  Rest as directed. Ask your health care provider what activities are safe for you.  Do physical therapy exercises as told by your health care provider. Physical therapy will help you regain strength and motion in your knee.  Follow instructions from your health care provider about: ? When you may start motion exercises. ? When you may start riding a stationary bike and doing other low-impact activities. ? When you may start to jog and do other high-impact activities. Safety  Do not use the injured limb to support your body weight until your health care provider says that you can. Use crutches as told by your health care provider. General instructions  Do not use any products that contain nicotine or tobacco, such as cigarettes  and e-cigarettes. These can delay bone healing. If you need help quitting, ask your health care provider.  To prevent or treat constipation while you are taking prescription pain medicine, your health care provider may recommend that you: ? Drink enough fluid to keep your urine clear or pale yellow. ? Take over-the-counter or prescription medicines. ? Eat foods that are high in fiber, such as fresh fruits and vegetables, whole grains, and beans. ? Limit foods that are high in fat and processed sugars, such as fried and sweet foods.  Take over-the-counter and prescription medicines only as told by your health care provider.  Keep all follow-up visits as told by your health care provider. This is important. Contact a health care provider if:  You have more redness, swelling, or pain around an incision.  You have more fluid or blood coming from an incision.  Your incision feels warm to the touch.  You have a fever.  You have pain or swelling in your knee, and it gets worse.  You have pain that does not get better with medicine. Get help right away if:  You have trouble breathing.  You have pus or a bad smell coming from an incision.  You have numbness and tingling near the knee joint. Summary  After the procedure, it is common to have knee pain with bruising and swelling on your knee, calf, and ankle.  Icing your knee and raising your leg above the level of your heart will help control the pain and the swelling.  Do physical therapy exercises as told by your health care provider. Physical therapy will help you regain strength and motion in your knee. This information is not intended to replace advice given to you by your health care provider. Make sure you discuss any questions you have with your health care provider. Document Released: 04/22/2013 Document Revised: 06/26/2016 Document Reviewed: 06/26/2016 Elsevier Interactive Patient Education  2017 [place]  Anesthesia, Adult General anesthesia is the use of medicines to make a person "go to sleep" (be unconscious) for a medical procedure. General anesthesia is often recommended when a procedure:  Is long.  Requires you to be still or in an unusual position.  Is major and can cause you to lose blood.  Is impossible to do without general anesthesia.  The medicines used for general anesthesia are called general anesthetics. In addition to making you sleep, the medicines:  Prevent pain.  Control your blood pressure.  Relax your muscles.  Tell a health care provider about:  Any allergies you have.  All medicines you are taking, including vitamins, herbs, eye drops, creams, and over-the-counter medicines.  Any problems you or family members have had with anesthetic medicines.  Types of anesthetics you have had in the past.  Any bleeding disorders you have.  Any surgeries you have had.  Any medical conditions you have.  Any history of heart or lung conditions, such as heart failure, sleep apnea, or chronic obstructive pulmonary disease (COPD).  Whether you are pregnant or may be pregnant.  Whether you use tobacco, alcohol, marijuana, or street drugs.  Any history of Armed forces logistics/support/administrative officer.  Any history of depression or anxiety. What are the risks? Generally, this is a safe procedure. However, problems may occur, including:  Allergic reaction to anesthetics.  Lung and heart problems.  Inhaling food or liquids from your stomach into your lungs (aspiration).  Injury to nerves.  Waking up during your procedure and being unable to move (rare).  Extreme agitation or a state of mental confusion (delirium) when you wake up from the anesthetic.  Air in the bloodstream, which can lead to stroke.  These problems are more likely to develop if you are having a major surgery or if you have an advanced medical condition. You can prevent some of these complications by answering all of  your health care provider's questions thoroughly and by following all pre-procedure instructions. General anesthesia can cause side effects, including:  Nausea or vomiting  A sore throat from the breathing tube.  Feeling cold or shivery.  Feeling tired, washed out, or achy.  Sleepiness or drowsiness.  Confusion or agitation.  What happens before the procedure? Staying hydrated Follow instructions from your health care provider about hydration, which may include:  Up to 2 hours before the procedure - you may continue to drink clear liquids, such as water, clear fruit juice, black coffee, and plain tea.  Eating and drinking restrictions Follow instructions from your health care provider about eating and drinking, which may include:  8 hours before the procedure - stop eating heavy meals or foods such as meat, fried foods, or fatty foods.  6 hours before the procedure - stop eating light meals or foods, such as toast or cereal.  6 hours before the procedure - stop drinking milk or drinks that contain milk.  2 hours before the procedure - stop drinking clear liquids.  Medicines  Ask your health care provider about: ? Changing or stopping your regular medicines. This is especially important if you are taking diabetes medicines or blood thinners. ? Taking medicines such as aspirin and ibuprofen. These medicines can thin your blood. Do not take these medicines before your procedure if your health care provider instructs you not to. ? Taking new dietary supplements or medicines. Do not take these during the week before your procedure unless your health care provider approves them.  If you are told to take a medicine or to continue taking a medicine on the day of the procedure, take the medicine with sips of water. General instructions   Ask if you will be going home the same day, the following day, or after a longer hospital stay. ? Plan to have someone take you home. ? Plan to  have someone stay with you for the first 24 hours after you leave the hospital or clinic.  For 3-6 weeks before the procedure, try not to use any tobacco products, such as cigarettes, chewing tobacco, and e-cigarettes.  You may brush your teeth on the morning of the procedure, but make sure to spit out the toothpaste. What happens during the procedure?  You will be given anesthetics through a mask and through an IV tube in one of your veins.  You may receive medicine to help you relax (sedative).  As soon as you are asleep, a breathing tube may be used to help you breathe.  An anesthesia specialist will stay with you throughout the procedure. He or she will help keep you comfortable and safe by continuing to give you medicines and adjusting the amount of medicine that you get. He or she will also watch your blood pressure, pulse, and oxygen levels to make sure that the anesthetics do not cause any problems.  If a breathing tube was used to help you breathe, it will be removed before you wake up. The procedure may vary among health care providers and hospitals. What happens after the procedure?  You will wake up, often slowly, after the procedure is complete, usually in a recovery area.  Your blood pressure, heart rate, breathing rate, and blood oxygen level will be monitored until the medicines you were given have worn off.  You may be given medicine to help you calm down if you feel anxious or agitated.  If you will be going home the same day, your health care provider may check to make sure you can stand, drink, and urinate.  Your health care providers will treat your pain and side effects before you go home.  Do not drive for 24 hours if you received a sedative.  You may: ? Feel nauseous and vomit. ? Have a sore throat. ? Have mental slowness. ? Feel cold or shivery. ? Feel sleepy. ? Feel tired. ? Feel sore or achy, even in parts of your body where you did not have  surgery. This information is not intended to replace advice given to you by your health care provider. Make sure you discuss any questions you have with your health care provider. Document Released: 10/09/2007 Document Revised: 12/13/2015 Document Reviewed: 06/16/2015 Elsevier Interactive Patient Education  2018 [place] Anesthesia, Adult, Care After These instructions provide you with information about caring for yourself after your procedure. Your health care provider may also give you more specific instructions. Your treatment has been planned according to current medical practices, but problems sometimes occur. Call your health care provider if you have any problems or questions after your procedure. What can I expect after the procedure? After the procedure, it is common to have:  Vomiting.  A sore throat.  Mental slowness.  It is common to feel:  Nauseous.  Cold or shivery.  Sleepy.  Tired.  Sore or achy, even in parts of your body where you did not have surgery.  Follow these instructions at home: For at least 24 hours after the procedure:  Do not: ? Participate in activities where you could fall or become injured. ? Drive. ? Use heavy machinery. ? Drink alcohol. ? Take sleeping pills or medicines that cause drowsiness. ? Make important decisions or sign legal documents. ? Take care of children on your own.  Rest. Eating and drinking  If you vomit, drink water, juice, or soup when you can drink without vomiting.  Drink enough fluid to keep your urine clear or pale yellow.  Make sure you have little or no nausea before eating solid foods.  Follow the diet recommended by your health care provider. General instructions  Have a responsible adult stay with you until you are awake and alert.  Return to your normal activities as told by your health care provider. Ask your health care provider what activities are safe for you.  Take over-the-counter  and prescription medicines only as told by your health care provider.  If you smoke, do not smoke without supervision.  Keep all follow-up visits as told by your health care provider. This is important. Contact a health care provider if:  You continue to have nausea or vomiting at home, and medicines are not helpful.  You cannot drink fluids or start eating again.  You cannot urinate after 8-12 hours.  You develop a skin rash.  You have fever.  You have increasing redness at the site of your procedure. Get help right away if:  You have difficulty breathing.  You have chest pain.  You have unexpected bleeding.  You feel that you are having a life-threatening or urgent problem. This information is not intended to replace advice given to you by your health care provider. Make sure you discuss any questions you have with your health care provider. Document Released: 10/08/2000 Document Revised: 12/05/2015 Document Reviewed: 06/16/2015 Elsevier Interactive Patient Education  [employer].

## 2017-03-11 ENCOUNTER — Encounter (HOSPITAL_COMMUNITY)
Admission: RE | Admit: 2017-03-11 | Discharge: 2017-03-11 | Disposition: A | Discharge status: Home / Self Care | Payer: Medicare Other | Source: Ambulatory Visit | Attending: Orthopedic Surgery | Admitting: Orthopedic Surgery

## 2017-03-11 ENCOUNTER — Encounter (HOSPITAL_COMMUNITY): Payer: Self-pay

## 2017-03-11 DIAGNOSIS — Z01818 Encounter for other preprocedural examination: Secondary | ICD-10-CM

## 2017-03-11 DIAGNOSIS — Z79899 Other long term (current) drug therapy: Secondary | ICD-10-CM | POA: Diagnosis not present

## 2017-03-11 DIAGNOSIS — Z888 Allergy status to other drugs, medicaments and biological substances status: Secondary | ICD-10-CM | POA: Diagnosis not present

## 2017-03-11 DIAGNOSIS — S83281A Other tear of lateral meniscus, current injury, right knee, initial encounter: Secondary | ICD-10-CM | POA: Diagnosis not present

## 2017-03-11 DIAGNOSIS — Y9289 Other specified places as the place of occurrence of the external cause: Secondary | ICD-10-CM | POA: Diagnosis not present

## 2017-03-11 DIAGNOSIS — M170 Bilateral primary osteoarthritis of knee: Secondary | ICD-10-CM | POA: Diagnosis not present

## 2017-03-11 DIAGNOSIS — X58XXXA Exposure to other specified factors, initial encounter: Secondary | ICD-10-CM | POA: Diagnosis not present

## 2017-03-11 DIAGNOSIS — S83241A Other tear of medial meniscus, current injury, right knee, initial encounter: Secondary | ICD-10-CM | POA: Diagnosis not present

## 2017-03-11 HISTORY — DX: Personal history of urinary calculi: Z87.442

## 2017-03-11 HISTORY — DX: Pure hypercholesterolemia, unspecified: E78.00

## 2017-03-11 LAB — SURGICAL PCR SCREEN
MRSA, PCR: NEGATIVE
STAPHYLOCOCCUS AUREUS: NEGATIVE

## 2017-03-11 LAB — BASIC METABOLIC PANEL
Anion gap: 8 (ref 5–15)
BUN: 13 mg/dL (ref 6–20)
CALCIUM: 9.4 mg/dL (ref 8.9–10.3)
CHLORIDE: 103 mmol/L (ref 101–111)
CO2: 28 mmol/L (ref 22–32)
CREATININE: 0.63 mg/dL (ref 0.44–1.00)
Glucose, Bld: 87 mg/dL (ref 65–99)
Potassium: 4.4 mmol/L (ref 3.5–5.1)
SODIUM: 139 mmol/L (ref 135–145)

## 2017-03-11 LAB — CBC WITH DIFFERENTIAL/PLATELET
BASOS PCT: 0 %
Basophils Absolute: 0 10*3/uL (ref 0.0–0.1)
EOS ABS: 0.2 10*3/uL (ref 0.0–0.7)
EOS PCT: 1 %
HCT: 35.6 % — ABNORMAL LOW (ref 36.0–46.0)
HEMOGLOBIN: 12 g/dL (ref 12.0–15.0)
Lymphocytes Relative: 35 %
Lymphs Abs: 4 10*3/uL (ref 0.7–4.0)
MCH: 34.2 pg — ABNORMAL HIGH (ref 26.0–34.0)
MCHC: 33.7 g/dL (ref 30.0–36.0)
MCV: 101.4 fL — ABNORMAL HIGH (ref 78.0–100.0)
MONOS PCT: 7 %
Monocytes Absolute: 0.9 10*3/uL (ref 0.1–1.0)
NEUTROS PCT: 57 %
Neutro Abs: 6.5 10*3/uL (ref 1.7–7.7)
PLATELETS: 408 10*3/uL — AB (ref 150–400)
RBC: 3.51 MIL/uL — ABNORMAL LOW (ref 3.87–5.11)
RDW: 12.6 % (ref 11.5–15.5)
WBC: 11.5 10*3/uL — AB (ref 4.0–10.5)

## 2017-03-13 NOTE — H&P (Signed)
Chief Complaint  Patient presents with  . Knee Injury      RIGHT      Amber Cunningham is a 56 y.o. female.   56 year old female had some mild pain in her left knee and then one day she extended her right knee and had acute pain and swelling. She eventually went for treatment and was found to have a torn meniscus and osteoarthritis all 3 compartments   She complains of moderate to severe dull aching pain across the front of her knee on the medial lateral side with associated swelling occasional mechanical symptoms of popping     Review of Systems Review of Systems  Constitutional: Negative for weight loss.  Skin: Negative for itching and rash.  Neurological: Negative for sensory change and focal weakness.          Past Medical History:  Diagnosis Date  . Anxiety    . Arthritis    . Depression    . Diabetes mellitus without complication ([place])    . GERD (gastroesophageal reflux disease)             Past Surgical History:  Procedure Laterality Date  . ABDOMINAL HYSTERECTOMY      . CARPAL TUNNEL RELEASE      . EYE SURGERY      . KIDNEY STONE EXTRACTION      . NECK SURGERY              Allergies  Allergen Reactions  . Levofloxacin    . Percocet [Oxycodone-Acetaminophen]              Current Outpatient Prescriptions  Medication Sig Dispense Refill  . Calcium Citrate-Vitamin D (CALCIUM + D PO) Take by mouth.      . clonazePAM (KLONOPIN) 0.5 MG tablet Take 0.5 mg by mouth 2 (two) times daily.      Marland Kitchen estradiol (ESTRACE) 1 MG tablet Take 1 mg by mouth daily.      . mirtazapine (REMERON) 30 MG tablet Take 30 mg by mouth at bedtime.      . Multiple Vitamin (MULTIVITAMIN) tablet Take 1 tablet by mouth daily.      Marland Kitchen omeprazole (PRILOSEC) 20 MG capsule Take 20 mg by mouth daily.      Marland Kitchen omeprazole (PRILOSEC) 40 MG capsule Take 40 mg by mouth daily.      . pravastatin (PRAVACHOL) 10 MG tablet Take 10 mg by mouth daily.      . valACYclovir (VALTREX) 500 MG tablet Take 500 mg  by mouth 2 (two) times daily.      Marland Kitchen venlafaxine XR (EFFEXOR-XR) 75 MG 24 hr capsule Take 150 mg by mouth daily with breakfast.        No current facility-administered medications for this visit.         Physical Exam BP (!) 142/85   Pulse 85   Wt 146 lb (66.2 kg)   BMI 25.86 kg/m  Physical Exam    The patient is well developed well nourished and well groomed.   Orientation to person place and time is normal   Mood is pleasant. Affect normal  Ambulatory status is remarkable for a limp in the involved extremity on the right          Right  Knee examination:  Inspection: Tenderness is noted over the medial joint line and severe lateral meniscus   ROM: Is limited by pain with a maximum flexion arc of 120  Stability: Collateral ligaments are stable,  the Lachman test and anterior and posterior drawer tests are normal   We do palpate lateral and medial joint line tenderness and a positive McMurray's for lateral  meniscal tear    Motor exam: Grade 5 motor strength in the quadriceps musculature    Skin: Warm dry and intact over the right leg                       Neuro: normal sensation   Vascular: 2+ DP pulse with normal color and no edema.    Currently the left lower extremity and knee examination revealed mild  tenderness medial and lateral no  swelling, full range of motion without contracture subluxation atrophy or tremor. Normal muscle tone no instability and the neurovascular status of the limb is normal.       Assessment and Plan:  IMAGING: I have read and interpret the xrays as follows: 3 compartment OA; MRI report lateral meniscus tear OA 3 compartments     Diagnosis and treatment:    Osteoarthritis of the knee   Torn lateral meniscus right knee     This procedure has been fully reviewed with the patient and written informed consent has been obtained.    Arthroscopy right knee with LAT MEN    [name], MD

## 2017-03-14 ENCOUNTER — Encounter (HOSPITAL_COMMUNITY)
Admission: RE | Disposition: A | Discharge status: Home / Self Care | Payer: Self-pay | Source: Ambulatory Visit | Attending: Orthopedic Surgery

## 2017-03-14 ENCOUNTER — Ambulatory Visit (HOSPITAL_COMMUNITY)
Admission: RE | Admit: 2017-03-14 | Discharge: 2017-03-14 | Disposition: A | Discharge status: Home / Self Care | Payer: Medicare Other | Source: Ambulatory Visit | Attending: Orthopedic Surgery | Admitting: Orthopedic Surgery

## 2017-03-14 ENCOUNTER — Ambulatory Visit (HOSPITAL_COMMUNITY): Payer: Medicare Other | Admitting: Anesthesiology

## 2017-03-14 ENCOUNTER — Encounter (HOSPITAL_COMMUNITY): Payer: Self-pay | Admitting: Anesthesiology

## 2017-03-14 DIAGNOSIS — Z888 Allergy status to other drugs, medicaments and biological substances status: Secondary | ICD-10-CM | POA: Insufficient documentation

## 2017-03-14 DIAGNOSIS — X58XXXA Exposure to other specified factors, initial encounter: Secondary | ICD-10-CM | POA: Insufficient documentation

## 2017-03-14 DIAGNOSIS — Y9289 Other specified places as the place of occurrence of the external cause: Secondary | ICD-10-CM | POA: Insufficient documentation

## 2017-03-14 DIAGNOSIS — M170 Bilateral primary osteoarthritis of knee: Secondary | ICD-10-CM | POA: Insufficient documentation

## 2017-03-14 DIAGNOSIS — M23321 Other meniscus derangements, posterior horn of medial meniscus, right knee: Secondary | ICD-10-CM | POA: Diagnosis not present

## 2017-03-14 DIAGNOSIS — Z79899 Other long term (current) drug therapy: Secondary | ICD-10-CM | POA: Insufficient documentation

## 2017-03-14 DIAGNOSIS — M1711 Unilateral primary osteoarthritis, right knee: Secondary | ICD-10-CM | POA: Diagnosis not present

## 2017-03-14 DIAGNOSIS — M2241 Chondromalacia patellae, right knee: Secondary | ICD-10-CM

## 2017-03-14 DIAGNOSIS — S83241A Other tear of medial meniscus, current injury, right knee, initial encounter: Secondary | ICD-10-CM | POA: Diagnosis not present

## 2017-03-14 DIAGNOSIS — S83281A Other tear of lateral meniscus, current injury, right knee, initial encounter: Secondary | ICD-10-CM | POA: Diagnosis not present

## 2017-03-14 DIAGNOSIS — M94261 Chondromalacia, right knee: Secondary | ICD-10-CM

## 2017-03-14 HISTORY — PX: KNEE ARTHROSCOPY WITH LATERAL MENISECTOMY: SHX6193

## 2017-03-14 SURGERY — ARTHROSCOPY, KNEE, WITH LATERAL MENISCECTOMY
Anesthesia: General | Site: Knee | Laterality: Right

## 2017-03-14 MED ORDER — ONDANSETRON 4 MG PO TBDP
ORAL_TABLET | ORAL | Status: AC
  Filled 2017-03-14: qty 1

## 2017-03-14 MED ORDER — IBUPROFEN 800 MG PO TABS
800.0000 mg | ORAL_TABLET | Freq: Once | ORAL | Status: AC
  Administered 2017-03-14: 800 mg via ORAL

## 2017-03-14 MED ORDER — BUPIVACAINE-EPINEPHRINE (PF) 0.5% -1:200000 IJ SOLN
INTRAMUSCULAR | Status: DC | PRN
  Administered 2017-03-14: 60 mL

## 2017-03-14 MED ORDER — HYDROCODONE-ACETAMINOPHEN 7.5-325 MG PO TABS: 1.0000 | ORAL_TABLET | ORAL | 0 refills | Status: DC | PRN

## 2017-03-14 MED ORDER — LIDOCAINE HCL (PF) 1 % IJ SOLN
INTRAMUSCULAR | Status: AC
Start: 2017-03-14 — End: 2017-03-14
  Filled 2017-03-14: qty 5

## 2017-03-14 MED ORDER — FENTANYL CITRATE (PF) 250 MCG/5ML IJ SOLN
INTRAMUSCULAR | Status: AC
  Filled 2017-03-14: qty 5

## 2017-03-14 MED ORDER — MIDAZOLAM HCL 2 MG/2ML IJ SOLN
1.0000 mg | Freq: Once | INTRAMUSCULAR | Status: AC | PRN
  Administered 2017-03-14: 2 mg via INTRAVENOUS

## 2017-03-14 MED ORDER — CEFAZOLIN SODIUM-DEXTROSE 2-4 GM/100ML-% IV SOLN
INTRAVENOUS | Status: AC
  Filled 2017-03-14: qty 100

## 2017-03-14 MED ORDER — IBUPROFEN 800 MG PO TABS
ORAL_TABLET | ORAL | Status: AC
  Filled 2017-03-14: qty 1

## 2017-03-14 MED ORDER — PROPOFOL 10 MG/ML IV BOLUS
INTRAVENOUS | Status: DC | PRN
  Administered 2017-03-14: 160 mg via INTRAVENOUS
  Administered 2017-03-14: 20 mg via INTRAVENOUS

## 2017-03-14 MED ORDER — ONDANSETRON HCL 4 MG/2ML IJ SOLN
4.0000 mg | Freq: Once | INTRAMUSCULAR | Status: AC
  Administered 2017-03-14: 4 mg via INTRAVENOUS

## 2017-03-14 MED ORDER — LIDOCAINE HCL (CARDIAC) 10 MG/ML IV SOLN
INTRAVENOUS | Status: DC | PRN
  Administered 2017-03-14: 40 mg via INTRAVENOUS

## 2017-03-14 MED ORDER — EPINEPHRINE PF 1 MG/ML IJ SOLN
INTRAMUSCULAR | Status: DC | PRN
  Administered 2017-03-14 (×2): 3000 mL

## 2017-03-14 MED ORDER — ONDANSETRON 4 MG PO TBDP
4.0000 mg | ORAL_TABLET | Freq: Once | ORAL | Status: AC
  Administered 2017-03-14: 4 mg via ORAL

## 2017-03-14 MED ORDER — KETOROLAC TROMETHAMINE 30 MG/ML IJ SOLN
30.0000 mg | Freq: Once | INTRAMUSCULAR | Status: AC
  Administered 2017-03-14: 30 mg via INTRAVENOUS

## 2017-03-14 MED ORDER — CHLORHEXIDINE GLUCONATE 4 % EX LIQD: 60.0000 mL | Freq: Once | CUTANEOUS | Status: DC

## 2017-03-14 MED ORDER — ONDANSETRON HCL 4 MG/2ML IJ SOLN
INTRAMUSCULAR | Status: AC
  Filled 2017-03-14: qty 2

## 2017-03-14 MED ORDER — ACETAMINOPHEN 325 MG PO TABS
ORAL_TABLET | ORAL | Status: AC
  Filled 2017-03-14: qty 2

## 2017-03-14 MED ORDER — HYDROCODONE-ACETAMINOPHEN 7.5-325 MG PO TABS
ORAL_TABLET | ORAL | Status: AC
  Filled 2017-03-14: qty 1

## 2017-03-14 MED ORDER — BUPIVACAINE-EPINEPHRINE (PF) 0.5% -1:200000 IJ SOLN
INTRAMUSCULAR | Status: AC
  Filled 2017-03-14: qty 60

## 2017-03-14 MED ORDER — CEFAZOLIN SODIUM-DEXTROSE 2-4 GM/100ML-% IV SOLN
2.0000 g | INTRAVENOUS | Status: AC
  Administered 2017-03-14: 2 g via INTRAVENOUS

## 2017-03-14 MED ORDER — KETOROLAC TROMETHAMINE 30 MG/ML IJ SOLN
INTRAMUSCULAR | Status: AC
  Filled 2017-03-14: qty 1

## 2017-03-14 MED ORDER — LIDOCAINE HCL (PF) 1 % IJ SOLN
INTRAMUSCULAR | Status: AC
  Filled 2017-03-14: qty 5

## 2017-03-14 MED ORDER — FENTANYL CITRATE (PF) 100 MCG/2ML IJ SOLN: 25.0000 ug | INTRAMUSCULAR | Status: DC | PRN

## 2017-03-14 MED ORDER — MIDAZOLAM HCL 2 MG/2ML IJ SOLN
INTRAMUSCULAR | Status: AC
  Filled 2017-03-14: qty 2

## 2017-03-14 MED ORDER — ACETAMINOPHEN 325 MG PO TABS
650.0000 mg | ORAL_TABLET | Freq: Four times a day (QID) | ORAL | Status: DC | PRN
  Administered 2017-03-14: 650 mg via ORAL

## 2017-03-14 MED ORDER — HYDROCODONE-ACETAMINOPHEN 7.5-325 MG PO TABS
1.0000 | ORAL_TABLET | Freq: Once | ORAL | Status: AC
  Administered 2017-03-14: 1 via ORAL

## 2017-03-14 MED ORDER — LACTATED RINGERS IV SOLN
INTRAVENOUS | Status: DC
  Administered 2017-03-14: 09:00:00 via INTRAVENOUS

## 2017-03-14 MED ORDER — FENTANYL CITRATE (PF) 100 MCG/2ML IJ SOLN
INTRAMUSCULAR | Status: DC | PRN
  Administered 2017-03-14: 25 ug via INTRAVENOUS
  Administered 2017-03-14: 50 ug via INTRAVENOUS

## 2017-03-14 MED ORDER — SUCCINYLCHOLINE CHLORIDE 20 MG/ML IJ SOLN
INTRAMUSCULAR | Status: AC
  Filled 2017-03-14: qty 1

## 2017-03-14 SURGICAL SUPPLY — 50 items
BAG HAMPER (MISCELLANEOUS) ×3 IMPLANT
BANDAGE ELASTIC 6 LF NS (GAUZE/BANDAGES/DRESSINGS) ×3 IMPLANT
BIT DRILL 2.0MX128MM (BIT) ×3 IMPLANT
BLADE 11 SAFETY STRL DISP (BLADE) ×3 IMPLANT
BLADE AGGRESSIVE PLUS 4.0 (BLADE) ×3 IMPLANT
BNDG CMPR MED 5X6 ELC HKLP NS (GAUZE/BANDAGES/DRESSINGS) ×1
CHLORAPREP W/TINT 26ML (MISCELLANEOUS) ×6 IMPLANT
CLOTH BEACON ORANGE TIMEOUT ST (SAFETY) ×3 IMPLANT
COOLER CRYO IC GRAV AND TUBE (ORTHOPEDIC SUPPLIES) ×3 IMPLANT
CUFF CRYO KNEE18X23 MED (MISCELLANEOUS) ×2 IMPLANT
CUFF TOURNIQUET SINGLE 34IN LL (TOURNIQUET CUFF) ×2 IMPLANT
CUTTER ANGLED DBL BITE 4.5 (BURR) IMPLANT
DECANTER SPIKE VIAL GLASS SM (MISCELLANEOUS) ×6 IMPLANT
GAUZE SPONGE 4X4 12PLY STRL (GAUZE/BANDAGES/DRESSINGS) ×3 IMPLANT
GAUZE SPONGE 4X4 16PLY XRAY LF (GAUZE/BANDAGES/DRESSINGS) ×3 IMPLANT
GAUZE XEROFORM 5X9 LF (GAUZE/BANDAGES/DRESSINGS) ×3 IMPLANT
GLOVE BIO SURGEON STRL SZ7 (GLOVE) ×2 IMPLANT
GLOVE BIOGEL PI IND STRL 7.0 (GLOVE) ×1 IMPLANT
GLOVE BIOGEL PI INDICATOR 7.0 (GLOVE) ×4
GLOVE SKINSENSE NS SZ8.0 LF (GLOVE) ×2
GLOVE SKINSENSE STRL SZ8.0 LF (GLOVE) ×1 IMPLANT
GLOVE SS N UNI LF 8.5 STRL (GLOVE) ×3 IMPLANT
GOWN STRL REUS W/TWL LRG LVL3 (GOWN DISPOSABLE) ×3 IMPLANT
GOWN STRL REUS W/TWL XL LVL3 (GOWN DISPOSABLE) ×3 IMPLANT
HLDR LEG FOAM (MISCELLANEOUS) ×1 IMPLANT
IV NS IRRIG 3000ML ARTHROMATIC (IV SOLUTION) ×6 IMPLANT
KIT BLADEGUARD II DBL (SET/KITS/TRAYS/PACK) ×3 IMPLANT
KIT ROOM TURNOVER AP CYSTO (KITS) ×3 IMPLANT
LEG HOLDER FOAM (MISCELLANEOUS) ×2
MANIFOLD NEPTUNE II (INSTRUMENTS) ×3 IMPLANT
MARKER SKIN DUAL TIP RULER LAB (MISCELLANEOUS) ×3 IMPLANT
NDL HYPO 18GX1.5 BLUNT FILL (NEEDLE) ×1 IMPLANT
NDL SPNL 18GX3.5 QUINCKE PK (NEEDLE) ×1 IMPLANT
NEEDLE HYPO 18GX1.5 BLUNT FILL (NEEDLE) ×3 IMPLANT
NEEDLE HYPO 21X1.5 SAFETY (NEEDLE) ×3 IMPLANT
NEEDLE SPNL 18GX3.5 QUINCKE PK (NEEDLE) ×3 IMPLANT
NS IRRIG 1000ML POUR BTL (IV SOLUTION) ×3 IMPLANT
PACK ARTHRO LIMB DRAPE STRL (MISCELLANEOUS) ×3 IMPLANT
PAD ABD 5X9 TENDERSORB (GAUZE/BANDAGES/DRESSINGS) ×3 IMPLANT
PAD ARMBOARD 7.5X6 YLW CONV (MISCELLANEOUS) ×3 IMPLANT
PADDING CAST COTTON 6X4 STRL (CAST SUPPLIES) ×3 IMPLANT
PADDING WEBRIL 6 STERILE (GAUZE/BANDAGES/DRESSINGS) ×2 IMPLANT
PROBE BIPOLAR 50 DEGREE SUCT (MISCELLANEOUS) ×3 IMPLANT
SET ARTHROSCOPY INST (INSTRUMENTS) ×3 IMPLANT
SET ARTHROSCOPY PUMP TUBE (IRRIGATION / IRRIGATOR) ×3 IMPLANT
SET BASIN LINEN APH (SET/KITS/TRAYS/PACK) ×3 IMPLANT
SUT ETHILON 3 0 FSL (SUTURE) ×3 IMPLANT
SYR 30ML LL (SYRINGE) ×3 IMPLANT
TUBE CONNECTING 12'X1/4 (SUCTIONS) ×3
TUBE CONNECTING 12X1/4 (SUCTIONS) ×6 IMPLANT

## 2017-03-14 NOTE — Op Note (Signed)
03/14/2017  10:07 AM  PATIENT:  Amber Cunningham  56 y.o. female  PRE-OPERATIVE DIAGNOSIS:  RIGHT KNEE LATERAL MENISCUS TEAR  POST-OPERATIVE DIAGNOSIS:  lateral and medial meniscal tear right knee  PROCEDURE:  Procedure(s): KNEE ARTHROSCOPY WITH LATERAL AND MEDIAL MENISECTOMY (Right)  SURGEON:  Surgeon(s) and Role:    * [name], MD - Primary   SURGEON:  Surgeon(s) and Role:    * [name], MD - Primary  PHYSICIAN ASSISTANT:   ASSISTANTS: none   ANESTHESIA:   none  EBL:  Total I/O In: 700 [I.V.:700] Out: 0   BLOOD ADMINISTERED:none  DRAINS: none   LOCAL MEDICATIONS USED:  Marcaine with epi 60 cc  SPECIMEN:  No Specimen  DISPOSITION OF SPECIMEN:  N/A  COUNTS:  YES  TOURNIQUET:    DICTATION: .Dragon Dictation  Knee arthroscopy dictation  The patient was identified in the preoperative holding area using 2 approved identification mechanisms. The chart was reviewed and updated. The surgical site was confirmed as right knee and marked with an indelible marker.  The patient was taken to the operating room for anesthesia. After successful  general anesthesia, 2 g Ancef was used as IV antibiotics.  The patient was placed in the supine position with the (right) the operative extremity in an arthroscopic leg holder and the opposite extremity in a padded leg holder.  The timeout was executed.  A lateral portal was established with an 11 blade and the scope was introduced into the joint. A diagnostic arthroscopy was performed in circumferential manner examining the entire knee joint. A medial portal was established and the diagnostic arthroscopy was repeated using a probe to palpate intra-articular structures as they were encountered.   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 The meniscal fragments were removed with a motorized shaver. The meniscus was balanced with a combination of a motorized shaver and a 50 wand until a stable rim was obtained.  We then addressed the lateral meniscus with a motorized shaver and wand debriding and removing torn meniscal tissue until a stable rim was confirmed by probing  Chondroplasties were done on the medial femoral condyle and patella  The arthroscopic pump was placed on the wash mode and any excess debris was removed from the joint using suction.  60 cc of Marcaine with epinephrine was injected through the arthroscope.  The portals were closed with 3-0 nylon suture.  A sterile bandage, Ace wrap and Cryo/Cuff was placed and the Cryo/Cuff was activated. The patient was taken to the recovery room in stable condition.   PLAN OF CARE: Discharge to home after PACU  PATIENT DISPOSITION:  PACU - hemodynamically stable.   Delay start of Pharmacological VTE agent (>24hrs) due to surgical blood loss or risk of bleeding: not applicable  [id number]

## 2017-03-14 NOTE — Transfer of Care (Signed)
Immediate Anesthesia Transfer of Care Note  Patient: Amber Cunningham  Procedure(s) Performed: Procedure(s): KNEE ARTHROSCOPY WITH LATERAL AND MEDIAL MENISECTOMY (Right)  Patient Location: PACU  Anesthesia Type:General  Level of Consciousness: awake  Airway & Oxygen Therapy: Patient Spontanous Breathing and Patient connected to nasal cannula oxygen  Post-op Assessment: Report given to RN  Post vital signs: Reviewed and stable  Last Vitals:  Vitals:   03/14/17 0848  Pulse: 81  Temp: 37.3 C    Last Pain:  Vitals:   03/14/17 0848  TempSrc: Oral  PainSc: 6       Patients Stated Pain Goal: 6 (79/15/05 6979)  Complications: No apparent anesthesia complications

## 2017-03-14 NOTE — Discharge Instructions (Signed)
You had moderate amount of arthritis in your knee this was treated with chondroplasty which means shaving of the cartilage until smooth  You also had a torn medial meniscus which was not seen on MRI this was debrided or removed  We also found a torn lateral meniscus as noted on MRI and the torn portion was removed

## 2017-03-14 NOTE — Anesthesia Procedure Notes (Signed)
Procedure Name: LMA Insertion Date/Time: 03/14/2017 9:36 AM Performed by: Tressie Stalker E Pre-anesthesia Checklist: Patient identified, Patient being monitored, Emergency Drugs available, Timeout performed and Suction available Patient Re-evaluated:Patient Re-evaluated prior to induction Oxygen Delivery Method: Circle System Utilized Preoxygenation: Pre-oxygenation with 100% oxygen Induction Type: IV induction Ventilation: Mask ventilation without difficulty LMA: LMA inserted LMA Size: 4.0 Laryngoscope Size: 3 Number of attempts: 1 Placement Confirmation: positive ETCO2 and breath sounds checked- equal and bilateral

## 2017-03-14 NOTE — Interval H&P Note (Signed)
History and Physical Interval Note:  03/14/2017 8:41 AM  Amber Cunningham  has presented today for surgery, with the diagnosis of RIGHT KNEE LATERAL MENISCUS TEAR  The various methods of treatment have been discussed with the patient and family. After consideration of risks, benefits and other options for treatment, the patient has consented to  Procedure(s): KNEE ARTHROSCOPY WITH LATERAL MENISECTOMY (Right) as a surgical intervention .  The patient's history has been reviewed, patient examined, no change in status, stable for surgery.  I have reviewed the patient's chart and labs.  Questions were answered to the patient's satisfaction.     [name]

## 2017-03-14 NOTE — Anesthesia Preprocedure Evaluation (Signed)
Anesthesia Evaluation  Patient identified by MRN, date of birth, ID band Patient awake    Airway Mallampati: I  TM Distance: >3 FB     Dental no notable dental hx. (+) Teeth Intact   Pulmonary    Pulmonary exam normal        Cardiovascular Exercise Tolerance: Good METS: 5 - 7 Mets negative cardio ROS   Rhythm:Regular Rate:Normal     Neuro/Psych    GI/Hepatic Neg liver ROS, GERD  Medicated,  Endo/Other  negative endocrine ROS  Renal/GU negative Renal ROS     Musculoskeletal  (+) Arthritis , Osteoarthritis,    Abdominal Normal abdominal exam  (+)   Peds  Hematology negative hematology ROS (+)   Anesthesia Other Findings   Reproductive/Obstetrics                             Anesthesia Physical Anesthesia Plan  ASA: II  Anesthesia Plan: General   Post-op Pain Management:    Induction:   PONV Risk Score and Plan:   Airway Management Planned: LMA  Additional Equipment:   Intra-op Plan:   Post-operative Plan: Extubation in OR  Informed Consent: I have reviewed the patients History and Physical, chart, labs and discussed the procedure including the risks, benefits and alternatives for the proposed anesthesia with the patient or authorized representative who has indicated his/her understanding and acceptance.   Dental advisory given  Plan Discussed with:   Anesthesia Plan Comments:         Anesthesia Quick Evaluation

## 2017-03-14 NOTE — Brief Op Note (Addendum)
03/14/2017  10:07 AM  PATIENT:  Amber Cunningham  56 y.o. female  PRE-OPERATIVE DIAGNOSIS:  RIGHT KNEE LATERAL MENISCUS TEAR  POST-OPERATIVE DIAGNOSIS:  lateral and medial meniscal tear right knee  PROCEDURE:  Procedure(s): KNEE ARTHROSCOPY WITH LATERAL AND MEDIAL MENISECTOMY (Right)  SURGEON:  Surgeon(s) and Role:    * [name], MD - Primary   SURGEON:  Surgeon(s) and Role:    * [name], MD - Primary  PHYSICIAN ASSISTANT:   ASSISTANTS: none   ANESTHESIA:   none  EBL:  Total I/O In: 700 [I.V.:700] Out: 0   BLOOD ADMINISTERED:none  DRAINS: none   LOCAL MEDICATIONS USED:  Marcaine with epi 60 cc  SPECIMEN:  No Specimen  DISPOSITION OF SPECIMEN:  N/A  COUNTS:  YES  TOURNIQUET:    DICTATION: .Dragon Dictation  Knee arthroscopy dictation  The patient was identified in the preoperative holding area using 2 approved identification mechanisms. The chart was reviewed and updated. The surgical site was confirmed as right knee and marked with an indelible marker.  The patient was taken to the operating room for anesthesia. After successful  general anesthesia, 2 g Ancef was used as IV antibiotics.  The patient was placed in the supine position with the (right) the operative extremity in an arthroscopic leg holder and the opposite extremity in a padded leg holder.  The timeout was executed.  A lateral portal was established with an 11 blade and the scope was introduced into the joint. A diagnostic arthroscopy was performed in circumferential manner examining the entire knee joint. A medial portal was established and the diagnostic arthroscopy was repeated using a probe to palpate intra-articular structures as they were encountered.   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 The meniscal fragments were removed with a motorized shaver. The meniscus was balanced with a combination of a motorized shaver and a 50 wand until a stable rim was obtained.  We then addressed the lateral meniscus with a motorized shaver and wand debriding and removing torn meniscal tissue until a stable rim was confirmed by probing  Chondroplasties were done on the medial femoral condyle and patella  The arthroscopic pump was placed on the wash mode and any excess debris was removed from the joint using suction.  60 cc of Marcaine with epinephrine was injected through the arthroscope.  The portals were closed with 3-0 nylon suture.  A sterile bandage, Ace wrap and Cryo/Cuff was placed and the Cryo/Cuff was activated. The patient was taken to the recovery room in stable condition.   PLAN OF CARE: Discharge to home after PACU  PATIENT DISPOSITION:  PACU - hemodynamically stable.   Delay start of Pharmacological VTE agent (>24hrs) due to surgical blood loss or risk of bleeding: not applicable  [id number]

## 2017-03-14 NOTE — Anesthesia Postprocedure Evaluation (Signed)
Anesthesia Post Note  Patient: Amber Cunningham  Procedure(s) Performed: Procedure(s) (LRB): KNEE ARTHROSCOPY WITH LATERAL AND MEDIAL MENISECTOMY (Right)  Patient location during evaluation: PACU Anesthesia Type: General Level of consciousness: awake and alert and oriented Pain management: pain level controlled Vital Signs Assessment: post-procedure vital signs reviewed and stable Respiratory status: spontaneous breathing Cardiovascular status: stable Postop Assessment: no signs of nausea or vomiting Anesthetic complications: no     Last Vitals:  Vitals:   03/14/17 1030 03/14/17 1045  BP: (!) 143/76 (!) 142/85  Pulse: 100 100  Resp: 17 14  Temp:    SpO2: 96% 100%    Last Pain:  Vitals:   03/14/17 0848  TempSrc: Oral  PainSc: 6                  Caitland Porchia

## 2017-03-15 ENCOUNTER — Encounter (HOSPITAL_COMMUNITY): Payer: Self-pay | Admitting: Orthopedic Surgery

## 2017-03-20 ENCOUNTER — Encounter: Payer: Medicare Other | Admitting: Orthopedic Surgery

## 2017-03-20 NOTE — Progress Notes (Signed)
Patient came in for suture removal from [place] 03/14/17. Sutures removed. Incision was clean, dry, and intact. No drainage noted. Some redness noted around the knee, most likely from bandage, but no redness noted at  Incision site.

## 2017-03-26 ENCOUNTER — Telehealth: Payer: Self-pay | Admitting: Orthopedic Surgery

## 2017-03-26 NOTE — Telephone Encounter (Signed)
Declined until surgery

## 2017-03-26 NOTE — Telephone Encounter (Signed)
ROUTING TO DR HARRISON FOR REVIEW 

## 2017-03-26 NOTE — Telephone Encounter (Signed)
Patient requests refill on Hydrocodone/Acetaminophen 7.5-325  Mgs.   Qty  30   Sig: Take 1-2 tablets by mouth every 4 (four) hours as needed for moderate pain

## 2017-03-26 NOTE — Telephone Encounter (Signed)
Had surgery 03/14/17

## 2017-03-27 ENCOUNTER — Other Ambulatory Visit: Payer: Self-pay | Admitting: Orthopedic Surgery

## 2017-03-27 MED ORDER — HYDROCODONE-ACETAMINOPHEN 5-325 MG PO TABS: 1.0000 | ORAL_TABLET | Freq: Four times a day (QID) | ORAL | 0 refills | Status: DC | PRN

## 2017-03-29 ENCOUNTER — Ambulatory Visit: Payer: Medicare Other | Admitting: Orthopedic Surgery

## 2017-04-02 ENCOUNTER — Ambulatory Visit (INDEPENDENT_AMBULATORY_CARE_PROVIDER_SITE_OTHER): Payer: Medicare Other | Admitting: Orthopedic Surgery

## 2017-04-02 ENCOUNTER — Encounter: Payer: Self-pay | Admitting: Orthopedic Surgery

## 2017-04-02 VITALS — BP 135/79 | HR 81 | Ht 63.0 in | Wt 146.0 lb

## 2017-04-02 DIAGNOSIS — Z4889 Encounter for other specified surgical aftercare: Secondary | ICD-10-CM

## 2017-04-02 MED ORDER — HYDROCODONE-ACETAMINOPHEN 5-325 MG PO TABS: 1.0000 | ORAL_TABLET | ORAL | 0 refills | Status: DC | PRN

## 2017-04-02 NOTE — Patient Instructions (Signed)
Continue ice 4 times a day  Start home exercises.

## 2017-04-02 NOTE — Progress Notes (Signed)
Patient ID: Amber Cunningham, female   DOB: [birth year], 56 y.o.   MRN: [id number]  POSTOP VISIT   Chief Complaint  Patient presents with  . Post-op Follow-up    SARK DOS 03/14/17    56 years old postop day 18    OP REPORT  03/14/2017  10:07 AM  PATIENT:  Amber Cunningham  56 y.o. 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 She wants her medicine refilled  She can flex her knee to 120 she can get full extension or portals are clean  Meds ordered this encounter  Medications  . HYDROcodone-acetaminophen (NORCO/VICODIN) 5-325 MG tablet    Sig: Take 1 tablet by mouth every 4 (four) hours as needed for moderate pain.    Dispense:  42 tablet    Refill:  0    3:52 PM [name], MD 04/02/2017

## 2017-04-24 ENCOUNTER — Ambulatory Visit: Payer: Medicare Other | Admitting: Orthopedic Surgery

## 2017-04-26 ENCOUNTER — Ambulatory Visit: Payer: Medicare Other | Admitting: Orthopedic Surgery

## 2017-04-29 DIAGNOSIS — Z9889 Other specified postprocedural states: Secondary | ICD-10-CM | POA: Insufficient documentation

## 2017-04-30 ENCOUNTER — Encounter: Payer: Self-pay | Admitting: Orthopedic Surgery

## 2017-04-30 ENCOUNTER — Ambulatory Visit (INDEPENDENT_AMBULATORY_CARE_PROVIDER_SITE_OTHER): Payer: Self-pay | Admitting: Orthopedic Surgery

## 2017-04-30 VITALS — BP 126/59 | HR 86 | Ht 63.0 in | Wt 146.0 lb

## 2017-04-30 DIAGNOSIS — M25562 Pain in left knee: Secondary | ICD-10-CM

## 2017-04-30 DIAGNOSIS — Z9889 Other specified postprocedural states: Secondary | ICD-10-CM

## 2017-04-30 MED ORDER — HYDROCODONE-ACETAMINOPHEN 5-325 MG PO TABS: 1.0000 | ORAL_TABLET | ORAL | 0 refills | Status: DC | PRN

## 2017-04-30 MED ORDER — PREDNISONE 10 MG PO TABS: 10.0000 mg | ORAL_TABLET | Freq: Three times a day (TID) | ORAL | 1 refills | Status: DC

## 2017-04-30 NOTE — Progress Notes (Signed)
Chief Complaint  Patient presents with  . Routine Post Op    SARK DOS 03/14/17   Amber Cunningham is here for her 6 week postop visit after knee arthroscopy findings noted below  She's not doing well still complaining of pain and swelling medial and lateral aspects right knee and now has persistent pain in the medial side of her left knee with a catching sensation  She's neurovascularly intact portals look clean she has a small effusion she can flex her knee actively to 100 she has full extension she is tender to palpation diffusely about the knee  I suspect this is from the arthritis that we found in the knee  Recommend prednisone 10 mg 3 times a day for 2 weeks and then come back for recheck. Continue hydrocodone for pain as needed   PRE-OPERATIVE DIAGNOSIS:  RIGHT KNEE LATERAL MENISCUS TEAR  POST-OPERATIVE DIAGNOSIS:  lateral and medial meniscal tear right knee  PROCEDURE:  Procedure(s): KNEE ARTHROSCOPY WITH LATERAL AND MEDIAL MENISECTOMY (Right)  SURGEON:  Surgeon(s) and Role:    [name], MD - Primary  The operative findings are   Medial:chondromalacia medial femoral condyle grade 2 diffuse, torn medial meniscus posterior horn Lateral: torn lateral meniscus body and anterior horn diffuse chondromalacia lateral femoral condyle Patellofemoral: chondromalacia patella central median ridge and trochlea Notch: normal anterior cruciate ligament PCL

## 2017-05-08 DIAGNOSIS — F331 Major depressive disorder, recurrent, moderate: Secondary | ICD-10-CM | POA: Diagnosis not present

## 2017-05-17 ENCOUNTER — Ambulatory Visit: Payer: Medicare Other | Admitting: Orthopedic Surgery

## 2017-05-22 ENCOUNTER — Encounter: Payer: Self-pay | Admitting: Orthopedic Surgery

## 2017-05-22 ENCOUNTER — Ambulatory Visit (INDEPENDENT_AMBULATORY_CARE_PROVIDER_SITE_OTHER): Payer: Medicare Other | Admitting: Orthopedic Surgery

## 2017-05-22 VITALS — BP 153/81 | HR 109 | Ht 63.0 in | Wt 146.0 lb

## 2017-05-22 DIAGNOSIS — Z9889 Other specified postprocedural states: Secondary | ICD-10-CM

## 2017-05-22 DIAGNOSIS — Z23 Encounter for immunization: Secondary | ICD-10-CM | POA: Diagnosis not present

## 2017-05-22 DIAGNOSIS — M25562 Pain in left knee: Secondary | ICD-10-CM

## 2017-05-22 MED ORDER — PREDNISONE 10 MG PO TABS: 10.0000 mg | ORAL_TABLET | Freq: Three times a day (TID) | ORAL | 1 refills | Status: DC

## 2017-05-22 MED ORDER — HYDROCODONE-ACETAMINOPHEN 5-325 MG PO TABS: 1.0000 | ORAL_TABLET | ORAL | 0 refills | Status: DC | PRN

## 2017-05-22 NOTE — Patient Instructions (Signed)
Apply ice 4 x a day

## 2017-05-22 NOTE — Progress Notes (Signed)
Chief Complaint  Patient presents with  . Follow-up    RIGHT KNEE   Dos 03/14/17  She is having some hypersensitivity and persistent swelling in the improved with the prednisone.  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  We discussed possibility of injecting the area as well

## 2017-06-12 ENCOUNTER — Encounter: Payer: Self-pay | Admitting: Orthopedic Surgery

## 2017-06-12 ENCOUNTER — Ambulatory Visit (INDEPENDENT_AMBULATORY_CARE_PROVIDER_SITE_OTHER): Payer: Self-pay | Admitting: Orthopedic Surgery

## 2017-06-12 VITALS — BP 141/81 | HR 74 | Ht 63.0 in | Wt 145.0 lb

## 2017-06-12 DIAGNOSIS — Z9889 Other specified postprocedural states: Secondary | ICD-10-CM

## 2017-06-12 DIAGNOSIS — Z4889 Encounter for other specified surgical aftercare: Secondary | ICD-10-CM

## 2017-06-12 DIAGNOSIS — M25562 Pain in left knee: Secondary | ICD-10-CM

## 2017-06-12 MED ORDER — HYDROCODONE-ACETAMINOPHEN 5-325 MG PO TABS: 1.0000 | ORAL_TABLET | ORAL | 0 refills | Status: DC | PRN

## 2017-06-12 MED ORDER — PREDNISONE 10 MG PO TABS: 10.0000 mg | ORAL_TABLET | Freq: Every day | ORAL | 2 refills | Status: DC

## 2017-06-12 NOTE — Progress Notes (Signed)
POST OP .  Chief Complaint  Patient presents with  . Post-op Follow-up    has had some soreness, is in the process of moving  Surgery date August 30  Seems to be making some progress but still has a lot of hypersensitivity over the right knee with joint effusion.  Range of motion is improved to 125 degrees she can now come to full extension.   Encounter Diagnoses  Name Primary?  . S/P arthroscopy of right knee  03/14/2017 Yes  . Aftercare following surgery   . Left knee pain, unspecified chronicity    Meds ordered this encounter  Medications  . predniSONE (DELTASONE) 10 MG tablet    Sig: Take 1 tablet (10 mg total) by mouth daily with breakfast.    Dispense:  30 tablet    Refill:  2  . HYDROcodone-acetaminophen (NORCO/VICODIN) 5-325 MG tablet    Sig: Take 1 tablet by mouth every 4 (four) hours as needed for moderate pain.    Dispense:  42 tablet    Refill:  0   FU 5 WEEKS

## 2017-07-22 ENCOUNTER — Ambulatory Visit (INDEPENDENT_AMBULATORY_CARE_PROVIDER_SITE_OTHER): Payer: Medicare Other

## 2017-07-22 ENCOUNTER — Encounter: Payer: Self-pay | Admitting: Orthopedic Surgery

## 2017-07-22 ENCOUNTER — Ambulatory Visit (INDEPENDENT_AMBULATORY_CARE_PROVIDER_SITE_OTHER): Payer: Medicare Other | Admitting: Orthopedic Surgery

## 2017-07-22 VITALS — BP 114/71 | HR 81 | Ht 63.0 in | Wt 145.0 lb

## 2017-07-22 DIAGNOSIS — Z9889 Other specified postprocedural states: Secondary | ICD-10-CM | POA: Diagnosis not present

## 2017-07-22 DIAGNOSIS — G8929 Other chronic pain: Secondary | ICD-10-CM | POA: Diagnosis not present

## 2017-07-22 DIAGNOSIS — M5441 Lumbago with sciatica, right side: Secondary | ICD-10-CM

## 2017-07-22 DIAGNOSIS — M25561 Pain in right knee: Secondary | ICD-10-CM

## 2017-07-22 MED ORDER — HYDROCODONE-ACETAMINOPHEN 5-325 MG PO TABS: 1.0000 | ORAL_TABLET | Freq: Four times a day (QID) | ORAL | 0 refills | Status: DC | PRN

## 2017-07-22 MED ORDER — PREDNISONE 10 MG (48) PO TBPK: ORAL_TABLET | Freq: Every day | ORAL | 1 refills | Status: DC

## 2017-07-22 NOTE — Patient Instructions (Addendum)
Stop prednisone daily and start new prednisone dose pack prescription  Limit your opiod use (hydrocoodne)  What You Need to Know About Prescription Opioid Pain Medicine        Please be advised. You are on a medication which is classified as an "opiod". The CDC the [facility]  has recently advised all providers to advise patient's that these medications have certain risks which include but are not limited to:    drug intolerance  drug addiction  respiratory depression   respiratory failure  Death  Please keep these medications locked away. If you feel that you are becoming addicted to these medicines or you are having difficulties with these medications please alert your provider.   As your provider I will attempt to wean you off of these medications when you're severe acute pain has been taking care of. However, if we cannot wean you off of this medication you will be sent to a pain management center where they can better manage chronic pain   Opioids are powerful medicines that are used to treat moderate to severe pain. Opioids should be taken with the supervision of a trained health care provider. They should be taken for the shortest period of time as possible. This is because opioids can be addictive and the longer you take opioids, the greater your risk of addiction (opioid use disorder). What do opioids do? Opioids help reduce or eliminate pain. When used for short periods of time, they can help you:  Sleep better.  Do better in physical or occupational therapy.  Feel better in the first few days after an injury.  Recover from surgery. What kind of problems can opioids cause? Opioids can cause side effects, such as:  Constipation.  Nausea.  Vomiting.  Drowsiness.  Confusion.  Opioid use disorder.  Breathing difficulties (respiratory depression). Using opioid pain medicines for longer than 3 days increases your risk of these side  effects. Taking opioid pain medicine for a long period of time can affect your ability to do daily tasks. It also puts you at risk for:  Car accidents.  Heart attack.  Overdose, which can sometimes lead to death. What can increase my risk for developing problems while taking opioids? You may be at an especially high risk for problems while taking opioids if you:  Are over the age of 72.  Are pregnant.  Have kidney or liver disease.  Have certain mental health conditions, such as depression or anxiety.  Have a history of substance use disorder.  Have had an opioid overdose in the past. How do I stop taking opioids if I have been taking them for a long time? If you have been taking opioid medicine for more than a few weeks, you may need to slowly stop taking them (taper). Tapering your use of opioids can decrease your chances of experiencing withdrawal symptoms, such as:  Abdominal pain and cramping.  Nausea.  Sweating.  Sleepiness.  Restlessness.  Uncontrollable shaking (tremors).  Cravings for the medicine. Do not attempt to taper your use of opioids on your own. Talk with your health care provider about how to do this. Your health care provider may prescribe a step-down schedule based on how much medicine you are taking and how long you have been taking it. What are the benefits of stopping the use of opioids? By switching from opioid pain medicine to non-opioid pain management options, you will decrease your risk of accidents and injuries associated with long-term opioid  use. You will also be able to:  Monitor your pain more accurately and know when to seek medical care if it is not improving.  Decrease risk to others around you. Having opioids in the home increases the risk for accidental or intentional use or overdose by others. How can I treat pain without opioids? Pain can be managed with many types of alternative treatments. Ask your health care provider to refer  you to one or more specialists who can help you manage pain through:  Physical or occupational therapy.  Counseling (cognitive-behavioral therapy).  Good nutrition.  Biofeedback.  Massage.  Meditation.  Non-opioid medicine.  Following a gentle exercise program. Where can I get support? If you have been taking opioids for a long time, you may benefit from receiving support for quitting from a local support group or counselor. Ask your health care provider for a referral to these resources in your area. When should I seek medical care? Seek medical care right away if you are taking opioids and you experience any of the following:  Difficulty breathing.  Breathing that is more shallow or slower than normal.  A very slow heartbeat (pulse).  Severe confusion.  Unconsciousness.  Sleepiness.  Difficulty waking from sleep.  Slurred speech.  Nausea and vomiting.  Cold, clammy skin.  Blue lips or fingernails.  Limpness.  Abnormally small pupils. If you think that you or someone else may have taken too much of an opioid medicine, get medical help right away. Do not wait to see if the symptoms go away on their own. Call your local emergency services (911 in the [place] or call the hotline of the [facility] 319-550-3642 in the [place].).  Where can I get more information? To learn more about opioid medicines, visit the Centers for Disease Control and Prevention web site Opioid Basics at https://keller-santana.com/. Summary  Opioid medicines can help you manage moderate-to-severe pain for a short period of time.  Taking opioid pain medicine for a long period of time puts you at risk for unintentional accidents, injury, and even death.  If you think that you or someone else may have taken too much of an opioid, get medical help right away. This information is not intended to replace advice given to you by your health care provider. Make  sure you discuss any questions you have with your health care provider. Document Released: 07/29/2015 Document Revised: 02/24/2016 Document Reviewed: 02/11/2015 Elsevier Interactive Patient Education  2017 [employer].  Use heat on your back Lie flat on your back for 2 days of bed rest

## 2017-07-22 NOTE — Progress Notes (Signed)
Progress note follow-up visit routine Chief Complaint  Patient presents with  . Knee Pain    pain now up back of right knee / into buttock and back     Status post knee arthroscopy August 2018 postoperatively persistent pain, treated with prednisone and hydrocodone as well as exercise program.  Op note:  03/14/2017  10:07 AM  PATIENT:  Amber Cunningham  57 y.o. female  PRE-OPERATIVE DIAGNOSIS:  RIGHT KNEE LATERAL MENISCUS TEAR  POST-OPERATIVE DIAGNOSIS:  lateral and medial meniscal tear right knee  PROCEDURE:  Procedure(s): KNEE ARTHROSCOPY WITH LATERAL AND MEDIAL MENISECTOMY (Right)-[id number]  The operative findings were:   Medial: chondromalacia medial femoral condyle grade 2 diffuse, torn medial meniscus posterior horn Lateral: torn lateral meniscus body and anterior horn diffuse chondromalacia lateral femoral condyle Patellofemoral: chondromalacia patella central median ridge and trochlea Notch: normal anterior cruciate ligament PCL.   Current Outpatient Medications:  .  Calcium Citrate-Vitamin D (CALCIUM + D PO), Take 1 tablet by mouth daily. , Disp: , Rfl:  .  clonazePAM (KLONOPIN) 0.5 MG tablet, Take one half (0.5) tablets by mouth every morning AND one half (0.5) tablets in the evening only if needed, Disp: , Rfl:  .  estradiol (ESTRACE) 1 MG tablet, Take 1 mg by mouth daily., Disp: , Rfl:  .  HYDROcodone-acetaminophen (NORCO/VICODIN) 5-325 MG tablet, Take 1 tablet by mouth every 4 (four) hours as needed for moderate pain., Disp: 42 tablet, Rfl: 0 .  ibuprofen (ADVIL,MOTRIN) 800 MG tablet, Take 800 mg by mouth every 8 (eight) hours as needed for mild pain or moderate pain., Disp: , Rfl:  .  mirtazapine (REMERON) 30 MG tablet, Take 30 mg by mouth at bedtime., Disp: , Rfl:  .  Multiple Vitamin (MULTIVITAMIN) tablet, Take 1 tablet by mouth daily., Disp: , Rfl:  .  omeprazole (PRILOSEC) 40 MG capsule, Take 40 mg by mouth daily., Disp: , Rfl:  .  pravastatin (PRAVACHOL) 10 MG  tablet, Take 10 mg by mouth daily., Disp: , Rfl:  .  predniSONE (DELTASONE) 10 MG tablet, Take 1 tablet (10 mg total) by mouth daily with breakfast., Disp: 30 tablet, Rfl: 2 .  valACYclovir (VALTREX) 500 MG tablet, Take 500 mg by mouth 2 (two) times daily., Disp: , Rfl:  .  venlafaxine XR (EFFEXOR-XR) 150 MG 24 hr capsule, Take 1 capsule by mouth every morning., Disp: , Rfl: 0 .  venlafaxine XR (EFFEXOR-XR) 75 MG 24 hr capsule, Take 75 mg by mouth daily with breakfast. , Disp: , Rfl:    hpi  Today she c/o: 1 week history of acute onset of constant severe dull aching lower back pain radiating down right leg associated with tremors in the right leg numbness and tingling radiating down the right  Review of Systems  Constitutional: Negative for chills, fever, malaise/fatigue and weight loss.  Gastrointestinal: Negative.   Genitourinary: Negative.    Past Medical History:  Diagnosis Date  . Anxiety   . Arthritis   . Depression   . GERD (gastroesophageal reflux disease)   . History of kidney stones   . Hypercholesteremia    BP 114/71   Pulse 81   Ht 5\' 3"  (1.6 m)   Wt 145 lb (65.8 kg)   BMI 25.69 kg/m   Physical Exam  Constitutional: She is oriented to person, place, and time. She appears well-developed and well-nourished.  Neurological: She is alert and oriented to person, place, and time.  Psychiatric: She has a normal mood and affect.  Judgment normal.  Vitals reviewed.  Tenderness in the lower back no instability decreased range of motion painful flexion no increase in muscle tension  Right knee small effusion flexion 120 full extension no tenderness or erythema no skin rash  Right lower extremity muscle tone and muscle function strength normal full range of motion at the ankle no instability of the ankle or knee alignment normal  Pulse and perfusion normal capillary refill normal  No sensory deficit although feeling of numbness reflexes normal straight leg raise positive  for radicular pain  Left lower extremity normal range of motion stability strength and alignment no tenderness or swelling   X-ray lumbar spine lumbar spondylosis please see my dictated report   Encounter Diagnoses  Name Primary?  . S/P arthroscopy of right knee  03/14/2017   . Chronic pain of right knee   . Acute right-sided low back pain with right-sided sciatica Yes    I refilled her prescription with opioid warning I started her on a prednisone Dosepak follow-up in 2 weeks she is allergic to gabapentin

## 2017-08-07 ENCOUNTER — Ambulatory Visit: Payer: Medicare Other | Admitting: Orthopedic Surgery

## 2017-08-12 DIAGNOSIS — K5730 Diverticulosis of large intestine without perforation or abscess without bleeding: Secondary | ICD-10-CM | POA: Diagnosis not present

## 2017-08-12 DIAGNOSIS — E7800 Pure hypercholesterolemia, unspecified: Secondary | ICD-10-CM | POA: Diagnosis not present

## 2017-08-12 DIAGNOSIS — Z299 Encounter for prophylactic measures, unspecified: Secondary | ICD-10-CM | POA: Diagnosis not present

## 2017-08-12 DIAGNOSIS — Z789 Other specified health status: Secondary | ICD-10-CM | POA: Diagnosis not present

## 2017-08-12 DIAGNOSIS — Z6823 Body mass index (BMI) 23.0-23.9, adult: Secondary | ICD-10-CM | POA: Diagnosis not present

## 2017-08-12 DIAGNOSIS — F419 Anxiety disorder, unspecified: Secondary | ICD-10-CM | POA: Diagnosis not present

## 2017-08-12 DIAGNOSIS — R109 Unspecified abdominal pain: Secondary | ICD-10-CM | POA: Diagnosis not present

## 2017-08-12 DIAGNOSIS — I10 Essential (primary) hypertension: Secondary | ICD-10-CM | POA: Diagnosis not present

## 2017-08-19 ENCOUNTER — Encounter: Payer: Self-pay | Admitting: Orthopedic Surgery

## 2017-08-19 ENCOUNTER — Ambulatory Visit (INDEPENDENT_AMBULATORY_CARE_PROVIDER_SITE_OTHER): Payer: Medicare Other | Admitting: Orthopedic Surgery

## 2017-08-19 VITALS — BP 132/84 | HR 87 | Ht 63.0 in | Wt 143.0 lb

## 2017-08-19 DIAGNOSIS — M5441 Lumbago with sciatica, right side: Secondary | ICD-10-CM

## 2017-08-19 DIAGNOSIS — G8929 Other chronic pain: Secondary | ICD-10-CM

## 2017-08-19 DIAGNOSIS — M5442 Lumbago with sciatica, left side: Secondary | ICD-10-CM | POA: Diagnosis not present

## 2017-08-19 DIAGNOSIS — M25561 Pain in right knee: Secondary | ICD-10-CM | POA: Diagnosis not present

## 2017-08-19 DIAGNOSIS — Z9889 Other specified postprocedural states: Secondary | ICD-10-CM

## 2017-08-19 MED ORDER — HYDROCODONE-ACETAMINOPHEN 5-325 MG PO TABS: 1.0000 | ORAL_TABLET | Freq: Four times a day (QID) | ORAL | 0 refills | Status: DC | PRN

## 2017-08-19 NOTE — Progress Notes (Signed)
Progress Note   Patient ID: Amber Cunningham, female   DOB: 16-Nov-1960, 57 y.o.   MRN: [id number]  Chief Complaint  Patient presents with  . Knee Pain    right  . Back Pain    right sided sciatica    57 year old female had knee scope in August and some postoperative continued knee pain developed right sided sciatica which is now progressed to bilateral back pain bilateral leg pain and bilateral sensory loss lateral leg L5 distribution no improvement after 6 weeks of prednisone and hydrocodone   On her last visit she had acute onset of constant severe dull aching lower back pain radiating down the right leg associated with tremors in the right leg numbness and tingling radiating from the lower back across the buttock and into the right foot     ROS  Recent diarrhea scheduled for abdominal ultrasound no urine loss, no fever chills or weight loss or fatigue  Current Meds  Medication Sig  . Calcium Citrate-Vitamin D (CALCIUM + D PO) Take 1 tablet by mouth daily.   . clonazePAM (KLONOPIN) 0.5 MG tablet Take one half (0.5) tablets by mouth every morning AND one half (0.5) tablets in the evening only if needed  . estradiol (ESTRACE) 1 MG tablet Take 1 mg by mouth daily.  Marland Kitchen HYDROcodone-acetaminophen (NORCO) 5-325 MG tablet Take 1 tablet by mouth every 6 (six) hours as needed for moderate pain.  . mirtazapine (REMERON) 30 MG tablet Take 30 mg by mouth at bedtime.  . Multiple Vitamin (MULTIVITAMIN) tablet Take 1 tablet by mouth daily.  Marland Kitchen omeprazole (PRILOSEC) 40 MG capsule Take 40 mg by mouth daily.  . pravastatin (PRAVACHOL) 10 MG tablet Take 10 mg by mouth daily.  . valACYclovir (VALTREX) 500 MG tablet Take 500 mg by mouth 2 (two) times daily.  Marland Kitchen venlafaxine XR (EFFEXOR-XR) 150 MG 24 hr capsule Take 1 capsule by mouth every morning.  . venlafaxine XR (EFFEXOR-XR) 75 MG 24 hr capsule Take 75 mg by mouth daily with breakfast.     Allergies  Allergen Reactions  . Gabapentin Other (See  Comments)    Blurry vision  . Percocet [Oxycodone-Acetaminophen] Other (See Comments)    Makes pt feel like she will have a heart attack  . Prazosin Other (See Comments)    Stops pt heart  . Levofloxacin Rash     BP 132/84   Pulse 87   Ht 5\' 3"  (1.6 m)   Wt 143 lb (64.9 kg)   BMI 25.33 kg/m   Physical Exam  Constitutional: She is oriented to person, place, and time. She appears well-developed and well-nourished.  Neurological: She is alert and oriented to person, place, and time.  Psychiatric: She has a normal mood and affect. Judgment normal.  Vitals reviewed.  Lumbar tenderness medial and lateral and midline as well as both gluteal regions with painful straight leg raises bilaterally right worse than left also associated with decreased sensation L5 dermatome reflexes 1+ at the knee 0 at the ankles toes downgoing.    Medical decision-making Encounter Diagnoses  Name Primary?  . S/P arthroscopy of right knee  03/14/2017 Yes  . Acute bilateral low back pain with bilateral sciatica   . Chronic pain of right knee       Meds ordered this encounter  Medications  . HYDROcodone-acetaminophen (NORCO) 5-325 MG tablet    Sig: Take 1 tablet by mouth every 6 (six) hours as needed for moderate pain.    Dispense:  28 tablet    Refill:  0   MRI scan ordered lumbar spine probable central herniated disc  [name], MD 08/19/2017 5:10 PM

## 2017-08-23 DIAGNOSIS — R1013 Epigastric pain: Secondary | ICD-10-CM | POA: Diagnosis not present

## 2017-08-23 DIAGNOSIS — R109 Unspecified abdominal pain: Secondary | ICD-10-CM | POA: Diagnosis not present

## 2017-08-23 DIAGNOSIS — K8689 Other specified diseases of pancreas: Secondary | ICD-10-CM | POA: Diagnosis not present

## 2017-08-23 DIAGNOSIS — R112 Nausea with vomiting, unspecified: Secondary | ICD-10-CM | POA: Diagnosis not present

## 2017-08-29 DIAGNOSIS — M4726 Other spondylosis with radiculopathy, lumbar region: Secondary | ICD-10-CM | POA: Diagnosis not present

## 2017-08-29 DIAGNOSIS — M5116 Intervertebral disc disorders with radiculopathy, lumbar region: Secondary | ICD-10-CM | POA: Diagnosis not present

## 2017-08-29 DIAGNOSIS — M5117 Intervertebral disc disorders with radiculopathy, lumbosacral region: Secondary | ICD-10-CM | POA: Diagnosis not present

## 2017-09-02 ENCOUNTER — Ambulatory Visit: Payer: Medicare Other | Admitting: Orthopedic Surgery

## 2017-09-05 ENCOUNTER — Other Ambulatory Visit: Payer: Self-pay | Admitting: Orthopedic Surgery

## 2017-09-05 ENCOUNTER — Telehealth: Payer: Self-pay | Admitting: Orthopedic Surgery

## 2017-09-05 DIAGNOSIS — G8929 Other chronic pain: Secondary | ICD-10-CM

## 2017-09-05 DIAGNOSIS — M25561 Pain in right knee: Principal | ICD-10-CM

## 2017-09-05 MED ORDER — HYDROCODONE-ACETAMINOPHEN 5-325 MG PO TABS: 1.0000 | ORAL_TABLET | Freq: Four times a day (QID) | ORAL | 0 refills | Status: DC | PRN

## 2017-09-05 NOTE — Telephone Encounter (Signed)
Patient requests refill on Hydrocodone/Acetaminophen 5--325  Mgs.   Qty  28  Sig: Take 1 tablet by mouth every 6 (six) hours as needed for moderate pain.  PATIENT STATES SHE USES [place], [place]

## 2017-09-05 NOTE — Progress Notes (Signed)
PWP error

## 2017-09-06 DIAGNOSIS — Z299 Encounter for prophylactic measures, unspecified: Secondary | ICD-10-CM | POA: Diagnosis not present

## 2017-09-06 DIAGNOSIS — I10 Essential (primary) hypertension: Secondary | ICD-10-CM | POA: Diagnosis not present

## 2017-09-06 DIAGNOSIS — K5730 Diverticulosis of large intestine without perforation or abscess without bleeding: Secondary | ICD-10-CM | POA: Diagnosis not present

## 2017-09-06 DIAGNOSIS — E7800 Pure hypercholesterolemia, unspecified: Secondary | ICD-10-CM | POA: Diagnosis not present

## 2017-09-06 DIAGNOSIS — Z6823 Body mass index (BMI) 23.0-23.9, adult: Secondary | ICD-10-CM | POA: Diagnosis not present

## 2017-09-06 DIAGNOSIS — F418 Other specified anxiety disorders: Secondary | ICD-10-CM | POA: Diagnosis not present

## 2017-09-06 DIAGNOSIS — K8689 Other specified diseases of pancreas: Secondary | ICD-10-CM | POA: Diagnosis not present

## 2017-09-11 ENCOUNTER — Ambulatory Visit (INDEPENDENT_AMBULATORY_CARE_PROVIDER_SITE_OTHER): Payer: Medicare Other | Admitting: Orthopedic Surgery

## 2017-09-11 ENCOUNTER — Encounter: Payer: Self-pay | Admitting: Orthopedic Surgery

## 2017-09-11 ENCOUNTER — Other Ambulatory Visit: Payer: Self-pay | Admitting: Internal Medicine

## 2017-09-11 VITALS — BP 115/78 | HR 96 | Ht 63.0 in | Wt 143.0 lb

## 2017-09-11 DIAGNOSIS — Z9889 Other specified postprocedural states: Secondary | ICD-10-CM | POA: Diagnosis not present

## 2017-09-11 DIAGNOSIS — K8689 Other specified diseases of pancreas: Secondary | ICD-10-CM

## 2017-09-11 DIAGNOSIS — G8929 Other chronic pain: Secondary | ICD-10-CM

## 2017-09-11 DIAGNOSIS — M5441 Lumbago with sciatica, right side: Secondary | ICD-10-CM

## 2017-09-11 DIAGNOSIS — R935 Abnormal findings on diagnostic imaging of other abdominal regions, including retroperitoneum: Secondary | ICD-10-CM

## 2017-09-11 DIAGNOSIS — M25561 Pain in right knee: Secondary | ICD-10-CM

## 2017-09-11 DIAGNOSIS — M5442 Lumbago with sciatica, left side: Secondary | ICD-10-CM

## 2017-09-11 MED ORDER — HYDROCODONE-ACETAMINOPHEN 5-325 MG PO TABS: 1.0000 | ORAL_TABLET | Freq: Four times a day (QID) | ORAL | 0 refills | Status: DC | PRN

## 2017-09-11 NOTE — Patient Instructions (Signed)
ESI AT L5-S1

## 2017-09-11 NOTE — Addendum Note (Signed)
Addended byCandice Camp on: 09/11/2017 05:07 PM   Modules accepted: Orders

## 2017-09-11 NOTE — Progress Notes (Signed)
Progress Note   Patient ID: Amber Cunningham, female   DOB: [birth year], 57 y.o.   MRN: [id number]  Chief Complaint  Patient presents with  . Follow-up    MRI results of L-spine.    HPI  57 year old female presents for reevaluation MRI lumbar spine to evaluate persistent radiculopathy left leg after knee arthroscopy  MRI shows multifactorial moderate canal stenosis at L3-4 and L4-5 with L5-S1 disc protrusion and mild subarticular recess stenosis with impingement of both traversing S1 nerve roots  She still complains of pain she is on hydrocodone at this time  No outpatient medications have been marked as taking for the 09/11/17 encounter (Office Visit) with [name], MD.    Allergies  Allergen Reactions  . Gabapentin Other (See Comments)    Blurry vision  . Percocet [Oxycodone-Acetaminophen] Other (See Comments)    Makes pt feel like she will have a heart attack  . Prazosin Other (See Comments)    Stops pt heart  . Levofloxacin Rash     BP 115/78   Pulse 96   Ht 5\' 3"  (1.6 m)   Wt 143 lb (64.9 kg)   BMI 25.33 kg/m   Physical Exam  No changes. Medical decision-making  MRI was reviewed  It will be placed in the scanned documents  Recommend epidural x2 at L5-S1 then follow-up to see if we got any effect   Encounter Diagnoses  Name Primary?  . S/P arthroscopy of right knee  03/14/2017   . Chronic pain of right knee   . Acute bilateral low back pain with bilateral sciatica Yes     [name], MD 09/11/2017 4:51 PM

## 2017-09-13 ENCOUNTER — Other Ambulatory Visit: Payer: Self-pay | Admitting: Orthopedic Surgery

## 2017-09-13 DIAGNOSIS — M5442 Lumbago with sciatica, left side: Principal | ICD-10-CM

## 2017-09-13 DIAGNOSIS — M5441 Lumbago with sciatica, right side: Secondary | ICD-10-CM

## 2017-09-17 ENCOUNTER — Inpatient Hospital Stay
Admission: RE | Admit: 2017-09-17 | Discharge: 2017-09-17 | Disposition: A | Discharge status: Home / Self Care | Payer: Medicare Other | Source: Ambulatory Visit | Attending: Internal Medicine | Admitting: Internal Medicine

## 2017-09-22 ENCOUNTER — Ambulatory Visit
Admission: RE | Admit: 2017-09-22 | Discharge: 2017-09-22 | Disposition: A | Discharge status: Home / Self Care | Payer: Medicare Other | Source: Ambulatory Visit | Attending: Internal Medicine | Admitting: Internal Medicine

## 2017-09-22 DIAGNOSIS — D1803 Hemangioma of intra-abdominal structures: Secondary | ICD-10-CM | POA: Diagnosis not present

## 2017-09-22 DIAGNOSIS — K8689 Other specified diseases of pancreas: Secondary | ICD-10-CM

## 2017-09-22 DIAGNOSIS — R932 Abnormal findings on diagnostic imaging of liver and biliary tract: Secondary | ICD-10-CM | POA: Diagnosis not present

## 2017-09-22 DIAGNOSIS — K7689 Other specified diseases of liver: Secondary | ICD-10-CM | POA: Diagnosis not present

## 2017-09-22 DIAGNOSIS — R935 Abnormal findings on diagnostic imaging of other abdominal regions, including retroperitoneum: Secondary | ICD-10-CM

## 2017-09-22 MED ORDER — GADOBENATE DIMEGLUMINE 529 MG/ML IV SOLN
13.0000 mL | Freq: Once | INTRAVENOUS | Status: AC | PRN
  Administered 2017-09-22: 13 mL via INTRAVENOUS

## 2017-09-24 ENCOUNTER — Other Ambulatory Visit: Payer: Self-pay | Admitting: Orthopedic Surgery

## 2017-09-24 DIAGNOSIS — G8929 Other chronic pain: Secondary | ICD-10-CM

## 2017-09-24 DIAGNOSIS — M25561 Pain in right knee: Principal | ICD-10-CM

## 2017-09-24 DIAGNOSIS — F331 Major depressive disorder, recurrent, moderate: Secondary | ICD-10-CM | POA: Diagnosis not present

## 2017-09-24 NOTE — Telephone Encounter (Signed)
Patient requests refill on Hydrocodone/Acetaminophen 5-325  Mgs.   Qty 28  Sig: Take 1 tablet by mouth every 6 (six) hours as needed for moderate pain.  Patient uses [facility] in [place]

## 2017-09-25 ENCOUNTER — Ambulatory Visit
Admission: RE | Admit: 2017-09-25 | Discharge: 2017-09-25 | Disposition: A | Discharge status: Home / Self Care | Payer: Medicare Other | Source: Ambulatory Visit | Attending: Orthopedic Surgery | Admitting: Orthopedic Surgery

## 2017-09-25 DIAGNOSIS — M5441 Lumbago with sciatica, right side: Secondary | ICD-10-CM

## 2017-09-25 DIAGNOSIS — M545 Low back pain: Secondary | ICD-10-CM | POA: Diagnosis not present

## 2017-09-25 DIAGNOSIS — M5442 Lumbago with sciatica, left side: Principal | ICD-10-CM

## 2017-09-25 MED ORDER — METHYLPREDNISOLONE ACETATE 40 MG/ML INJ SUSP (RADIOLOG
120.0000 mg | Freq: Once | INTRAMUSCULAR | Status: AC
  Administered 2017-09-25: 120 mg via EPIDURAL

## 2017-09-25 MED ORDER — IOPAMIDOL (ISOVUE-M 200) INJECTION 41%
1.0000 mL | Freq: Once | INTRAMUSCULAR | Status: AC
  Administered 2017-09-25: 1 mL via EPIDURAL

## 2017-09-25 MED ORDER — HYDROCODONE-ACETAMINOPHEN 5-325 MG PO TABS: 1.0000 | ORAL_TABLET | Freq: Four times a day (QID) | ORAL | 0 refills | Status: DC | PRN

## 2017-09-25 NOTE — Discharge Instructions (Signed)

## 2017-10-09 ENCOUNTER — Telehealth: Payer: Self-pay | Admitting: Orthopedic Surgery

## 2017-10-09 DIAGNOSIS — M5442 Lumbago with sciatica, left side: Principal | ICD-10-CM

## 2017-10-09 DIAGNOSIS — M5441 Lumbago with sciatica, right side: Secondary | ICD-10-CM

## 2017-10-09 NOTE — Telephone Encounter (Signed)
Sent order called patient to advise.

## 2017-10-09 NOTE — Telephone Encounter (Signed)
Patient called to say that she needs a order sent to [facility] for her second back injection.  Please call and advise

## 2017-10-10 ENCOUNTER — Other Ambulatory Visit: Payer: Self-pay | Admitting: Orthopedic Surgery

## 2017-10-10 DIAGNOSIS — M25561 Pain in right knee: Principal | ICD-10-CM

## 2017-10-10 DIAGNOSIS — M5442 Lumbago with sciatica, left side: Principal | ICD-10-CM

## 2017-10-10 DIAGNOSIS — M5441 Lumbago with sciatica, right side: Secondary | ICD-10-CM

## 2017-10-10 DIAGNOSIS — G8929 Other chronic pain: Secondary | ICD-10-CM

## 2017-10-10 NOTE — Telephone Encounter (Signed)
Patient called for refill, pain medication - states aware of policy to request refills by 12:00 noon Thursday, or will be addressed Monday.  HYDROcodone-acetaminophen (NORCO) 5-325 MG tablet 28 tablet    pharmacy is [facility] in [place]

## 2017-10-11 MED ORDER — HYDROCODONE-ACETAMINOPHEN 5-325 MG PO TABS: 1.0000 | ORAL_TABLET | Freq: Four times a day (QID) | ORAL | 0 refills | Status: DC | PRN

## 2017-10-17 ENCOUNTER — Inpatient Hospital Stay: Admission: RE | Admit: 2017-10-17 | Payer: Medicare Other | Source: Ambulatory Visit

## 2017-10-28 ENCOUNTER — Other Ambulatory Visit: Payer: Medicare Other

## 2017-10-28 DIAGNOSIS — Z0000 Encounter for general adult medical examination without abnormal findings: Secondary | ICD-10-CM | POA: Diagnosis not present

## 2017-10-28 DIAGNOSIS — Z01419 Encounter for gynecological examination (general) (routine) without abnormal findings: Secondary | ICD-10-CM | POA: Diagnosis not present

## 2017-10-29 ENCOUNTER — Ambulatory Visit
Admission: RE | Admit: 2017-10-29 | Discharge: 2017-10-29 | Disposition: A | Discharge status: Home / Self Care | Payer: Medicare Other | Source: Ambulatory Visit | Attending: Orthopedic Surgery | Admitting: Orthopedic Surgery

## 2017-10-29 DIAGNOSIS — M47817 Spondylosis without myelopathy or radiculopathy, lumbosacral region: Secondary | ICD-10-CM | POA: Diagnosis not present

## 2017-10-29 DIAGNOSIS — M5441 Lumbago with sciatica, right side: Secondary | ICD-10-CM

## 2017-10-29 DIAGNOSIS — M5442 Lumbago with sciatica, left side: Principal | ICD-10-CM

## 2017-10-29 MED ORDER — METHYLPREDNISOLONE ACETATE 40 MG/ML INJ SUSP (RADIOLOG
120.0000 mg | Freq: Once | INTRAMUSCULAR | Status: AC
  Administered 2017-10-29: 120 mg via EPIDURAL

## 2017-10-29 MED ORDER — IOPAMIDOL (ISOVUE-M 200) INJECTION 41%
1.0000 mL | Freq: Once | INTRAMUSCULAR | Status: AC
  Administered 2017-10-29: 1 mL via EPIDURAL

## 2017-10-29 NOTE — Discharge Instructions (Signed)

## 2017-11-07 ENCOUNTER — Other Ambulatory Visit: Payer: Self-pay | Admitting: Orthopedic Surgery

## 2017-11-07 DIAGNOSIS — G8929 Other chronic pain: Secondary | ICD-10-CM

## 2017-11-07 DIAGNOSIS — M25561 Pain in right knee: Principal | ICD-10-CM

## 2017-11-07 NOTE — Telephone Encounter (Signed)
Hydrocodone-Acetaminophen  5/325 mg  Qty 28 Tablets  Take 1 tablet by mouth every 6 (six) hours as needed for moderate pain.  PATIENT USES Amber Cunningham

## 2017-11-08 MED ORDER — HYDROCODONE-ACETAMINOPHEN 5-325 MG PO TABS: 1.0000 | ORAL_TABLET | Freq: Four times a day (QID) | ORAL | 0 refills | Status: DC | PRN

## 2017-11-25 ENCOUNTER — Ambulatory Visit: Payer: Self-pay | Admitting: Orthopedic Surgery

## 2017-12-02 ENCOUNTER — Encounter: Payer: Self-pay | Admitting: Orthopedic Surgery

## 2017-12-02 ENCOUNTER — Ambulatory Visit (INDEPENDENT_AMBULATORY_CARE_PROVIDER_SITE_OTHER): Payer: Medicare Other | Admitting: Orthopedic Surgery

## 2017-12-02 VITALS — BP 107/68 | HR 78 | Ht 63.0 in | Wt 143.0 lb

## 2017-12-02 DIAGNOSIS — M5441 Lumbago with sciatica, right side: Secondary | ICD-10-CM | POA: Diagnosis not present

## 2017-12-02 DIAGNOSIS — M25561 Pain in right knee: Secondary | ICD-10-CM

## 2017-12-02 DIAGNOSIS — G8929 Other chronic pain: Secondary | ICD-10-CM | POA: Diagnosis not present

## 2017-12-02 DIAGNOSIS — Z9889 Other specified postprocedural states: Secondary | ICD-10-CM | POA: Diagnosis not present

## 2017-12-02 DIAGNOSIS — M5442 Lumbago with sciatica, left side: Secondary | ICD-10-CM | POA: Diagnosis not present

## 2017-12-02 MED ORDER — HYDROCODONE-ACETAMINOPHEN 5-325 MG PO TABS: 1.0000 | ORAL_TABLET | Freq: Four times a day (QID) | ORAL | 0 refills | Status: DC | PRN

## 2017-12-02 MED ORDER — NORTRIPTYLINE HCL 10 MG PO CAPS: 10.0000 mg | ORAL_CAPSULE | Freq: Every day | ORAL | 1 refills | Status: DC

## 2017-12-02 NOTE — Progress Notes (Addendum)
Progress Note   Patient ID: Amber Cunningham, female   DOB: May 06, 1961, 57 y.o.   MRN: [id number]  Chief Complaint  Patient presents with  . Back Pain    had 2 ESI's helped about a week      Medical decision-making Encounter Diagnoses  Name Primary?  . S/P arthroscopy of right knee  03/14/2017   . Acute bilateral low back pain with bilateral sciatica Yes     PLAN:   Problem #1 knee arthroscopy right doing well  Problem #2 Patient will be referred to neurosurgery and we will try nortriptyline for radicular leg pain    Meds ordered this encounter  Medications  . nortriptyline (PAMELOR) 10 MG capsule    Sig: Take 1 capsule (10 mg total) by mouth at bedtime.    Dispense:  30 capsule    Refill:  1         Chief Complaint  Patient presents with  . Back Pain    had 2 ESI's helped about a week     57 year old female had knee arthroscopy did well however in the postop period developed radicular leg pain after treatment with medication she was eventually sent for MRI and then for epidural injections.  She had epidurals they helped for about a week and she continues to be symptomatic in her right and left lower extremity.      Review of Systems  Gastrointestinal: Negative.   Genitourinary: Negative.    Current Meds  Medication Sig  . Calcium Citrate-Vitamin D (CALCIUM + D PO) Take 1 tablet by mouth daily.   Marland Kitchen estradiol (ESTRACE) 1 MG tablet Take 1 mg by mouth daily.  Marland Kitchen HYDROcodone-acetaminophen (NORCO) 5-325 MG tablet Take 1 tablet by mouth every 6 (six) hours as needed for moderate pain.  . mirtazapine (REMERON) 30 MG tablet Take 30 mg by mouth at bedtime.  . Multiple Vitamin (MULTIVITAMIN) tablet Take 1 tablet by mouth daily.  Marland Kitchen omeprazole (PRILOSEC) 40 MG capsule Take 40 mg by mouth daily.  . pravastatin (PRAVACHOL) 10 MG tablet Take 10 mg by mouth daily.  . valACYclovir (VALTREX) 500 MG tablet Take 500 mg by mouth 2 (two) times daily.  Marland Kitchen venlafaxine XR  (EFFEXOR-XR) 150 MG 24 hr capsule Take 1 capsule by mouth every morning.  . venlafaxine XR (EFFEXOR-XR) 75 MG 24 hr capsule Take 75 mg by mouth daily with breakfast.     Allergies  Allergen Reactions  . Gabapentin Other (See Comments)    Blurry vision  . Percocet [Oxycodone-Acetaminophen] Other (See Comments)    Makes pt feel like she will have a heart attack  . Prazosin Other (See Comments)    Stops pt heart  . Levofloxacin Rash     BP 107/68   Pulse 78   Ht 5\' 3"  (1.6 m)   Wt 143 lb (64.9 kg)   BMI 25.33 kg/m    [name], MD 12/02/2017 11:53 AM

## 2017-12-18 ENCOUNTER — Telehealth: Payer: Self-pay | Admitting: Orthopedic Surgery

## 2017-12-18 NOTE — Telephone Encounter (Signed)
Amber Cunningham called and said that she thought you had left a message for her.   She said she has an appointment scheduled with Dr. Patrice Paradise on 12/24/17.

## 2017-12-20 DIAGNOSIS — F418 Other specified anxiety disorders: Secondary | ICD-10-CM | POA: Diagnosis not present

## 2017-12-20 DIAGNOSIS — Z299 Encounter for prophylactic measures, unspecified: Secondary | ICD-10-CM | POA: Diagnosis not present

## 2017-12-20 DIAGNOSIS — E7800 Pure hypercholesterolemia, unspecified: Secondary | ICD-10-CM | POA: Diagnosis not present

## 2017-12-20 DIAGNOSIS — L03115 Cellulitis of right lower limb: Secondary | ICD-10-CM | POA: Diagnosis not present

## 2017-12-20 DIAGNOSIS — Z6823 Body mass index (BMI) 23.0-23.9, adult: Secondary | ICD-10-CM | POA: Diagnosis not present

## 2017-12-20 DIAGNOSIS — I10 Essential (primary) hypertension: Secondary | ICD-10-CM | POA: Diagnosis not present

## 2017-12-24 DIAGNOSIS — M545 Low back pain: Secondary | ICD-10-CM | POA: Diagnosis not present

## 2018-02-20 DIAGNOSIS — Z1339 Encounter for screening examination for other mental health and behavioral disorders: Secondary | ICD-10-CM | POA: Diagnosis not present

## 2018-02-20 DIAGNOSIS — Z1331 Encounter for screening for depression: Secondary | ICD-10-CM | POA: Diagnosis not present

## 2018-02-20 DIAGNOSIS — Z7189 Other specified counseling: Secondary | ICD-10-CM | POA: Diagnosis not present

## 2018-02-20 DIAGNOSIS — R5383 Other fatigue: Secondary | ICD-10-CM | POA: Diagnosis not present

## 2018-02-20 DIAGNOSIS — Z79899 Other long term (current) drug therapy: Secondary | ICD-10-CM | POA: Diagnosis not present

## 2018-02-20 DIAGNOSIS — Z299 Encounter for prophylactic measures, unspecified: Secondary | ICD-10-CM | POA: Diagnosis not present

## 2018-02-20 DIAGNOSIS — E7800 Pure hypercholesterolemia, unspecified: Secondary | ICD-10-CM | POA: Diagnosis not present

## 2018-02-20 DIAGNOSIS — Z1211 Encounter for screening for malignant neoplasm of colon: Secondary | ICD-10-CM | POA: Diagnosis not present

## 2018-02-20 DIAGNOSIS — R350 Frequency of micturition: Secondary | ICD-10-CM | POA: Diagnosis not present

## 2018-02-20 DIAGNOSIS — Z6823 Body mass index (BMI) 23.0-23.9, adult: Secondary | ICD-10-CM | POA: Diagnosis not present

## 2018-02-20 DIAGNOSIS — Z0000 Encounter for general adult medical examination without abnormal findings: Secondary | ICD-10-CM | POA: Diagnosis not present

## 2018-02-20 DIAGNOSIS — I10 Essential (primary) hypertension: Secondary | ICD-10-CM | POA: Diagnosis not present

## 2018-03-03 DIAGNOSIS — F331 Major depressive disorder, recurrent, moderate: Secondary | ICD-10-CM | POA: Diagnosis not present

## 2018-03-05 DIAGNOSIS — S0990XA Unspecified injury of head, initial encounter: Secondary | ICD-10-CM | POA: Diagnosis not present

## 2018-03-05 DIAGNOSIS — W0110XA Fall on same level from slipping, tripping and stumbling with subsequent striking against unspecified object, initial encounter: Secondary | ICD-10-CM | POA: Diagnosis not present

## 2018-03-05 DIAGNOSIS — R51 Headache: Secondary | ICD-10-CM | POA: Diagnosis not present

## 2018-03-05 DIAGNOSIS — S060X0A Concussion without loss of consciousness, initial encounter: Secondary | ICD-10-CM | POA: Diagnosis not present

## 2018-03-12 DIAGNOSIS — M5136 Other intervertebral disc degeneration, lumbar region: Secondary | ICD-10-CM | POA: Diagnosis not present

## 2018-03-12 DIAGNOSIS — M545 Low back pain: Secondary | ICD-10-CM | POA: Diagnosis not present

## 2018-03-12 DIAGNOSIS — M542 Cervicalgia: Secondary | ICD-10-CM | POA: Diagnosis not present

## 2018-04-21 DIAGNOSIS — F419 Anxiety disorder, unspecified: Secondary | ICD-10-CM | POA: Diagnosis not present

## 2018-05-05 IMAGING — MR MR ABDOMEN WO/W CM MRCP
11 of 19 series · 27 of 48 positions shown · IV contrast (multihance)
Comparison: Ultrasound 08/23/2017

CLINICAL DATA: Evaluate pancreatic ductal dilatation.

EXAM:
MRI ABDOMEN WITHOUT AND WITH CONTRAST (INCLUDING MRCP)
TECHNIQUE: Multiplanar multisequence MR imaging of the abdomen was performed
both before and after the administration of intravenous contrast.
Heavily T2-weighted images of the biliary and pancreatic ducts were
obtained, and three-dimensional MRCP images were rendered by post
processing.
CONTRAST:  13mL MULTIHANCE GADOBENATE DIMEGLUMINE 529 MG/ML IV SOLN

[Series 3: T2 · coronal · 5.0mm · 1.41mm/px · 2 of 32 slices shown (1 of 3)]
[im 1/32]
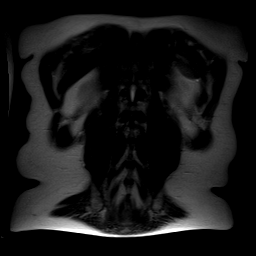
[im 32/32]
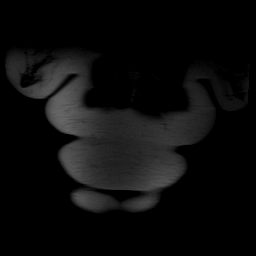

[Series 4: T2 · axial · 5.0mm · 1.41mm/px · z∈[-13,+169]mm · 2 of 34 slices shown (2 of 3)]
[im 1/34]
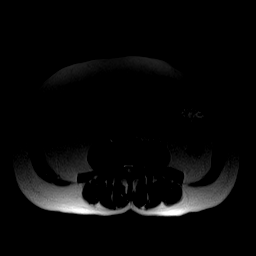
[im 34/34]
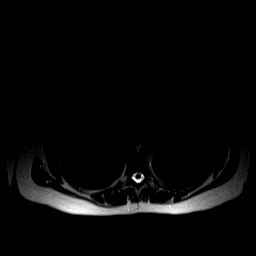

[Series 6: axial in out · axial · 5.5mm · 0.74mm/px · z∈[-31,+156]mm · 4 of 64 slices shown]
[im 1/64]
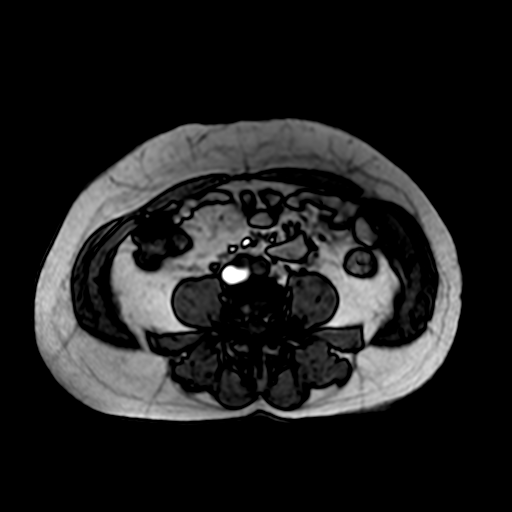
[im 22/64]
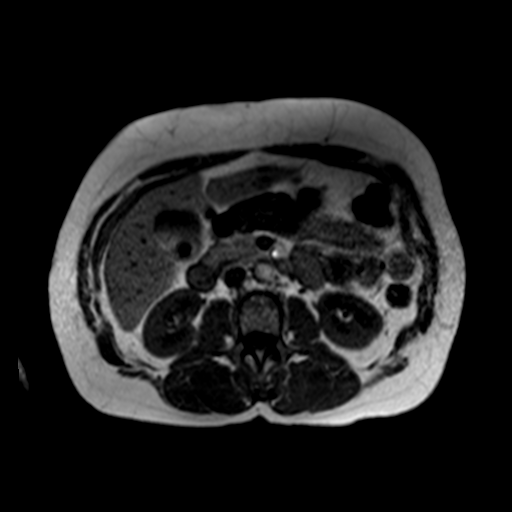
[im 43/64]
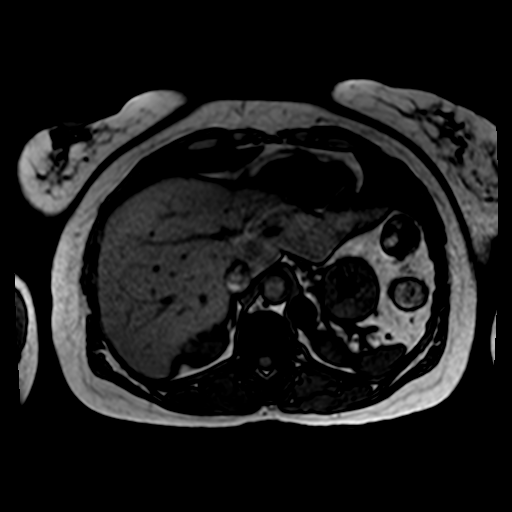
[im 64/64]
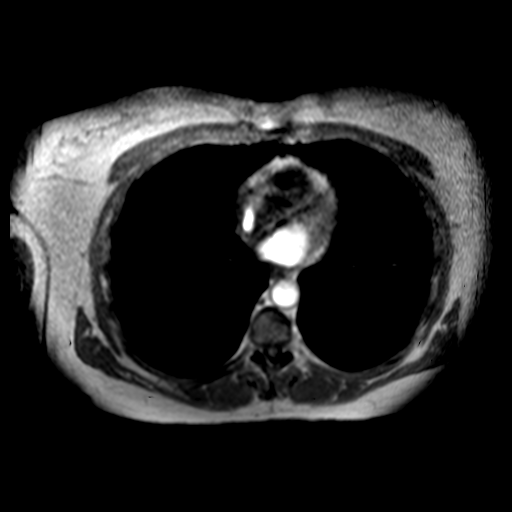

[Series 7: MRCP fat-sat · coronal · 3.0mm · 0.70mm/px · 2 of 47 slices shown]
[im 1/47]
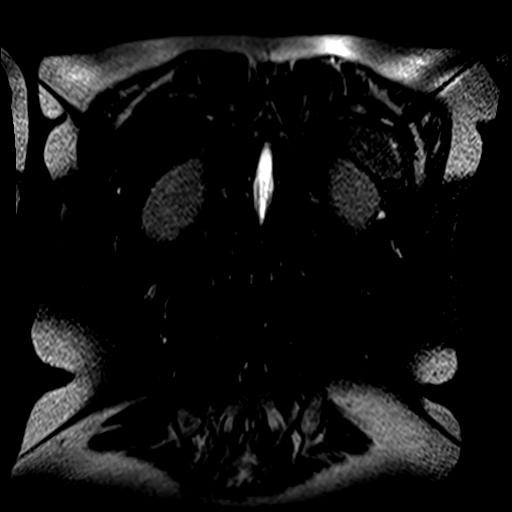
[im 47/47]
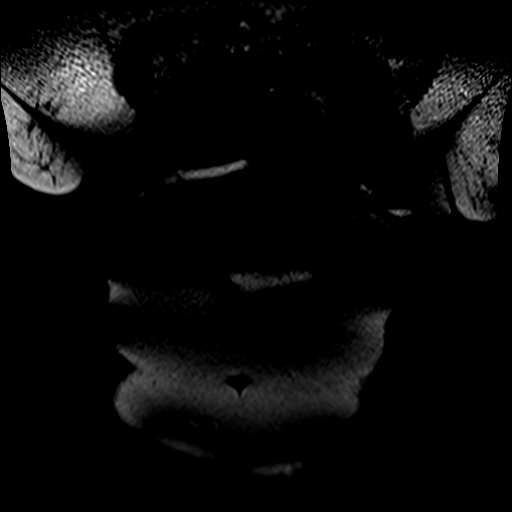

[Series 10: T2 · axial · 5.5mm · 0.74mm/px · 1 of 36 slices shown (3 of 3)]
[im 1/36]
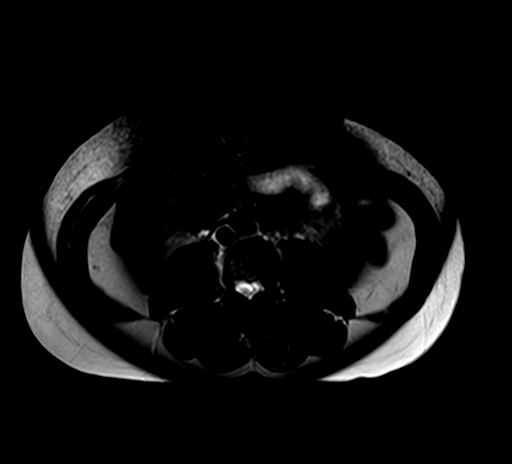

[Series 11: ep2d_diff_b50_500_800_p2_trig · axial · 5.5mm · 1.98mm/px · z∈[-23,+188]mm · 4 of 108 slices shown]
[im 1/108]
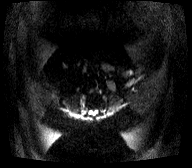
[im 36/108]
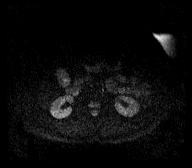
[im 72/108]
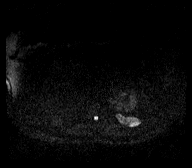
[im 108/108]
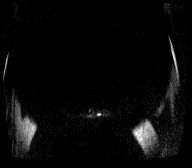

[Series 12: ep2d_diff_b50_500_800_p2_trig_adc · axial · 5.5mm · 1.98mm/px · 1 of 36 slices shown]
[im 1/36]
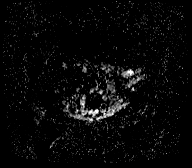

[Series 15: T1 dynamic · axial · non-contrast · 2.5mm · 0.78mm/px · z∈[-22,+175]mm · 3 of 80 slices shown]
[im 1/80]
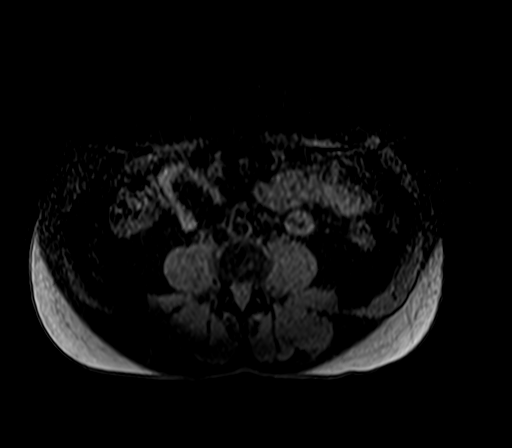
[im 40/80]
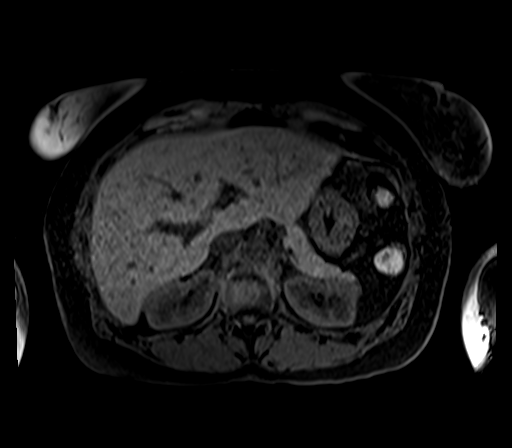
[im 80/80]
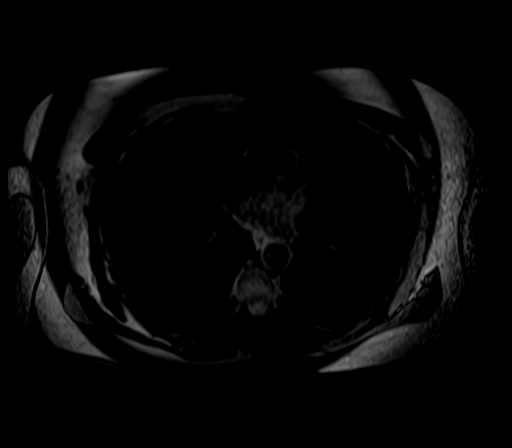

[Series 16: post 25 sec · axial · 2.5mm · 0.78mm/px · z∈[-22,+175]mm · 3 of 80 slices shown]
[im 1/80]
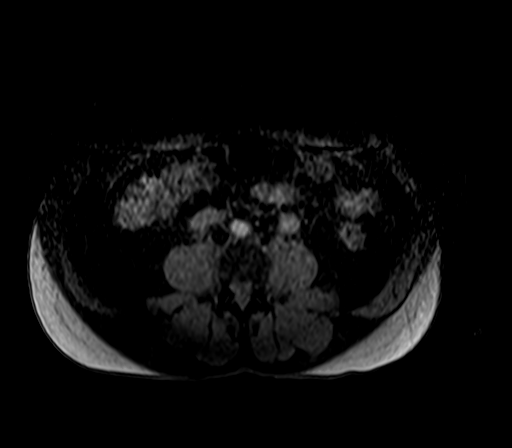
[im 40/80]
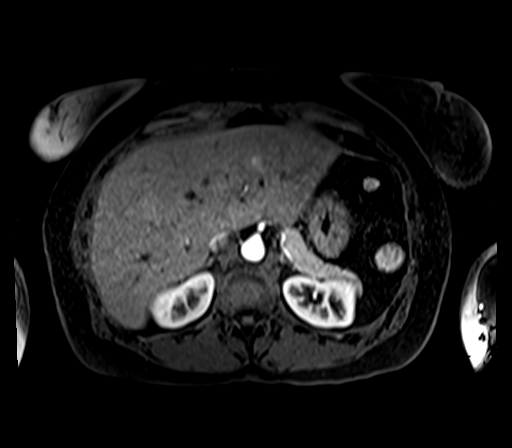
[im 80/80]
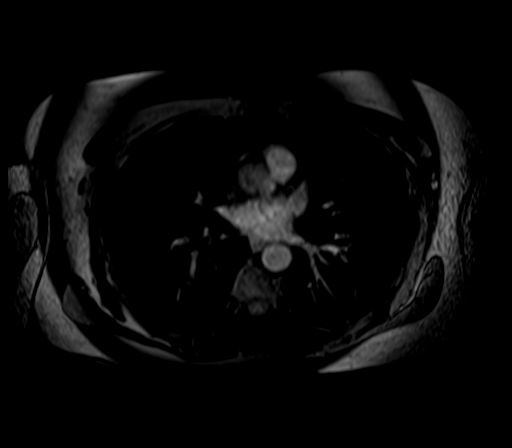

[Series 17: post 25 sec_sub · axial · 2.5mm · 0.78mm/px · z∈[-22,+175]mm · 3 of 80 slices shown]
[im 1/80]
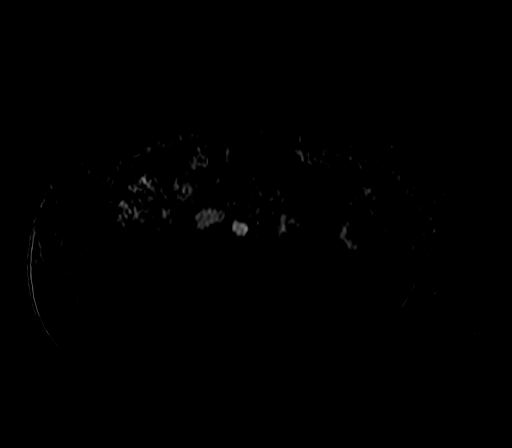
[im 40/80]
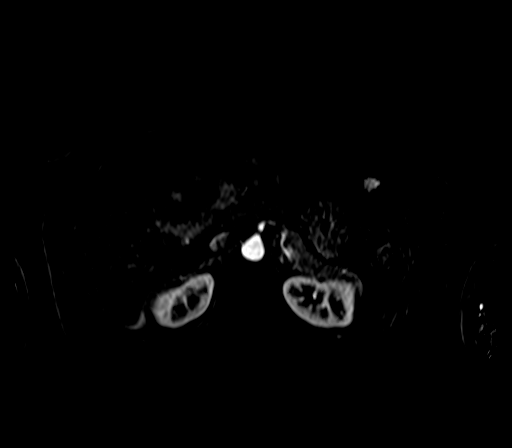
[im 80/80]
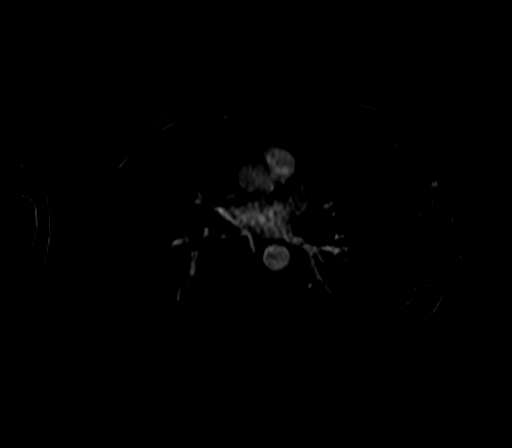

[Series 18: post 45 sec · axial · 2.5mm · 0.78mm/px · z∈[-22,+75]mm · 2 of 80 slices shown]
[im 1/80]
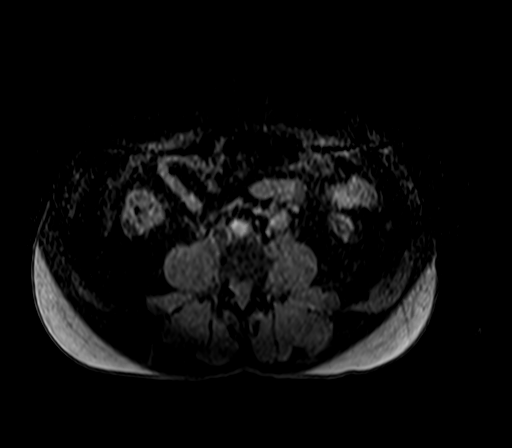
[im 40/80]
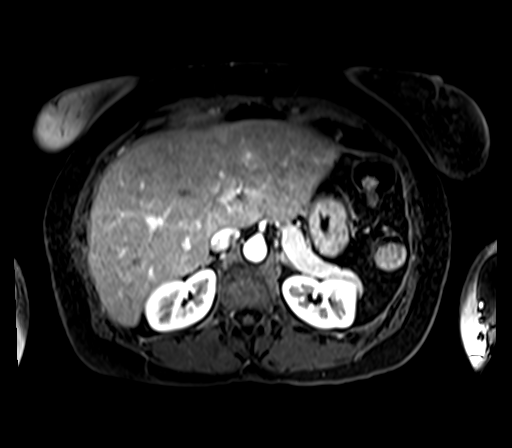

[27 of 48 positions shown; findings below may reference images not displayed]

FINDINGS: Lower chest: No acute findings.

Hepatobiliary: Tiny flash fill hemangioma is identified within the
lateral segment of left lobe of liver measuring 5 mm, image 42/18.
There are 2 tiny millimetric cysts identified within segment 8 of
the liver, image [DATE] and image [DATE]. No suspicious liver
abnormalities identified. The gallbladder appears normal. No biliary
dilatation.

Pancreas: No pancreatic ductal dilatation. No mass, inflammatory
changes, or other parenchymal abnormality identified.

Spleen:  Within normal limits in size and appearance.

Adrenals/Urinary Tract: Normal appearance of the right adrenal
gland. Left adrenal nodule measures 2.4 cm, image [DATE]. This has
signal characteristics of a benign adenoma. Small bilateral kidney
cysts measuring less than 1 cm, image [DATE] and image [DATE]

Stomach/Bowel: Visualized portions within the abdomen are
unremarkable.

Vascular/Lymphatic: Normal appearance of the abdominal aorta. No
enlarged lymph nodes within the upper abdomen.

Other:  None.

Musculoskeletal: No suspicious bone lesions identified.
IMPRESSION: 1. Unremarkable appearance of the pancreas. No significant
pancreatic ductal dilatation identified.
2. Small left lobe of liver flash fill hemangioma and right lobe of
liver millimetric cysts. No suspicious liver abnormalities.
3. Left adrenal gland adenoma.

## 2018-06-11 IMAGING — XA Imaging study
2 series · 2 of 2 positions shown · non-contrast
Comparison: none

CLINICAL DATA: Lumbosacral spondylosis without myelopathy.
RIGHT-sided symptoms have recurred.

[Series 1: ortho standard · 1 of 1 slices shown (1 of 2)]
[im 1/1]
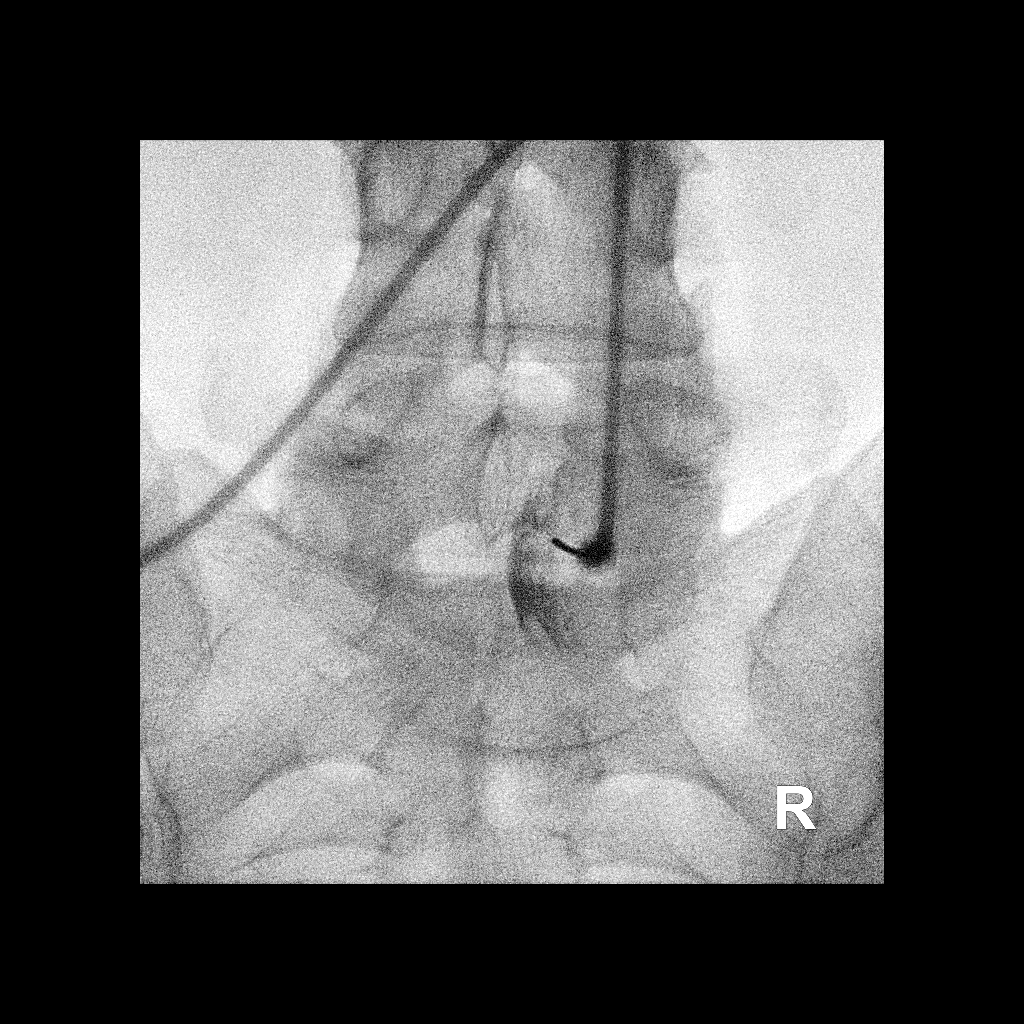

[Series 2: ortho standard · 1 of 1 slices shown (2 of 2)]
[im 1/1]
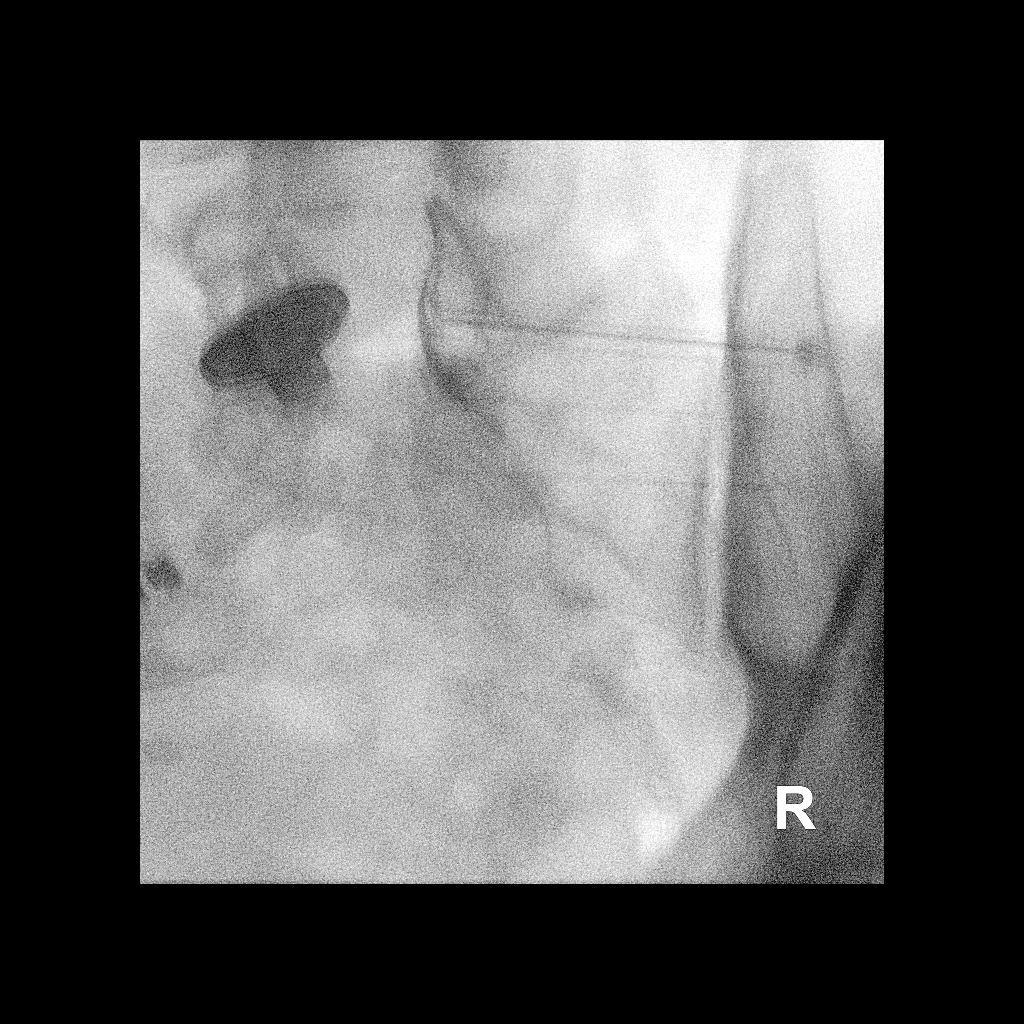

[2 of 2 positions shown; findings below may reference images not displayed]

FLUOROSCOPY TIME:  14 seconds corresponding to a Dose Area Product
of 13.74 Gy*m2

PROCEDURE:
The procedure, risks, benefits, and alternatives were explained to
the patient. Questions regarding the procedure were encouraged and
answered. The patient understands and consents to the procedure.

LUMBAR EPIDURAL INJECTION:

An interlaminar approach was performed on RIGHT at L5-S1. The
overlying skin was cleansed and anesthetized. A 20 gauge epidural
needle was advanced using loss-of-resistance technique.

DIAGNOSTIC EPIDURAL INJECTION:

Injection of Isovue-M 200 shows a good epidural pattern with spread
above and below the level of needle placement, primarily on the
RIGHT; no vascular opacification is seen.

THERAPEUTIC EPIDURAL INJECTION:

120.0 mg of Depo-Medrol mixed with 2 mL 1% lidocaine were instilled.
The procedure was well-tolerated, and the patient was discharged
thirty minutes following the injection in good condition.

COMPLICATIONS:
None.
IMPRESSION: Technically successful epidural injection on the RIGHT L5-S1 # 2.

## 2018-06-30 ENCOUNTER — Ambulatory Visit: Payer: Medicare HMO | Admitting: Orthopedic Surgery

## 2018-07-23 ENCOUNTER — Encounter: Payer: Self-pay | Admitting: Orthopedic Surgery

## 2018-07-23 ENCOUNTER — Ambulatory Visit (INDEPENDENT_AMBULATORY_CARE_PROVIDER_SITE_OTHER): Payer: Medicare HMO | Admitting: Orthopedic Surgery

## 2018-07-23 VITALS — BP 127/78 | HR 85 | Ht 63.0 in | Wt 140.0 lb

## 2018-07-23 DIAGNOSIS — G8929 Other chronic pain: Secondary | ICD-10-CM

## 2018-07-23 DIAGNOSIS — M25461 Effusion, right knee: Secondary | ICD-10-CM

## 2018-07-23 DIAGNOSIS — M25561 Pain in right knee: Secondary | ICD-10-CM

## 2018-07-23 DIAGNOSIS — Z9889 Other specified postprocedural states: Secondary | ICD-10-CM | POA: Diagnosis not present

## 2018-07-23 NOTE — Progress Notes (Signed)
Progress Note   Patient ID: Amber Cunningham, female   DOB: 21-Jan-1961, 58 y.o.   MRN: [id number]   Chief Complaint  Patient presents with  . Knee Pain    right     58 year old female had knee arthroscopy in August 2018 postoperatively developed right leg pain eventually had lumbar disc surgery by Dr. Patrice Paradise still having pain in her right lower back and right leg which went numb while driving  She presents with [place] complaining of right knee pain and swelling  03/14/2017  10:07 AM  PATIENT:  Amber Cunningham  58 y.o. female  PRE-OPERATIVE DIAGNOSIS:  RIGHT KNEE LATERAL MENISCUS TEAR  POST-OPERATIVE DIAGNOSIS:  lateral and medial meniscal tear right knee  PROCEDURE:  Procedure(s): KNEE ARTHROSCOPY WITH LATERAL AND MEDIAL MENISECTOMY (Right)  SURGEON:  Surgeon(s) and Role:    [name], MD - Primary   SURGEON:  Surgeon(s) and Role:    * [name], MD - Primary  PHYSICIAN ASSISTANT:   ASSISTANTS: none   ANESTHESIA:   none  EBL:  Total I/O In: 700 (I.V.:700) Out: 0   BLOOD ADMINISTERED:none  DRAINS: none   LOCAL MEDICATIONS USED:  Marcaine with epi 60 cc  SPECIMEN:  No Specimen  DISPOSITION OF SPECIMEN:  N/A  COUNTS:  YES  TOURNIQUET:    DICTATION: .Dragon Dictation   The operative findings are   Medial chondromalacia medial femoral condyle grade 2 diffuse, torn medial meniscus posterior horn Lateral torn lateral meniscus body and anterior horn diffuse chondromalacia lateral femoral condyle Patellofemoral chondromalacia patella central median ridge and trochlea Notch normal anterior cruciate ligament PCL       Review of Systems  Musculoskeletal: Positive for back pain.  Neurological: Positive for tingling and sensory change.     Allergies  Allergen Reactions  . Gabapentin Other (See Comments)    Blurry vision  . Percocet [Oxycodone-Acetaminophen] Other (See Comments)    Makes pt feel like she will have a heart attack  .  Prazosin Other (See Comments)    Stops pt heart  . Levofloxacin Rash     BP 127/78   Pulse 85   Ht 5\' 3"  (1.6 m)   Wt 140 lb (63.5 kg)   BMI 24.80 kg/m   Physical Exam Vitals signs reviewed.  Constitutional:      Appearance: She is well-developed.  Musculoskeletal:       Legs:  Neurological:     Mental Status: She is alert and oriented to person, place, and time.     Gait: Gait abnormal.  Psychiatric:        Attention and Perception: Attention normal.        Mood and Affect: Mood and affect normal.        Speech: Speech normal.        Behavior: Behavior normal.        Thought Content: Thought content normal.        Judgment: Judgment normal.      Medical decisions:   Data  Imaging:   NONE  Op note was reviewed  Encounter Diagnoses  Name Primary?  . Chronic pain of right knee   . S/P arthroscopy of right knee  03/14/2017   . Effusion of right knee Yes    PLAN:   Aspiration injection right knee  Procedure note injection and aspiration right knee joint  Verbal consent was obtained to aspirate and inject the right knee joint   Timeout was completed to  confirm the site of aspiration and injection  An 18-gauge needle was used to aspirate the knee joint from a suprapatellar lateral approach.  The medications used were 40 mg of Depo-Medrol and 1% lidocaine 3 cc  Anesthesia was provided by ethyl chloride and the skin was prepped with alcohol.  After cleaning the skin with alcohol an 18-gauge needle was used to aspirate the right knee joint.  We obtained 12  cc of fluid, clear thin fluid  We follow this by injection of 40 mg of Depo-Medrol and 3 cc 1% lidocaine.  There were no complications. A sterile bandage was applied.  Tylenol ibuprofen for pain  Follow-up 3 months x-ray right knee    [name], MD 07/23/2018 4:54 PM

## 2018-07-23 NOTE — Patient Instructions (Signed)
Tylenol 500 mg every 6 hours as needed  Ibuprofen 800 mg every 8 hours as needed  You have received an injection of steroids into the joint. 15% of patients will have increased pain within the 24 hours postinjection.   This is transient and will go away.   We recommend that you use ice packs on the injection site for 20 minutes every 2 hours and extra strength Tylenol 2 tablets every 8 as needed until the pain resolves.  If you continue to have pain after taking the Tylenol and using the ice please call the office for further instructions.

## 2018-09-17 ENCOUNTER — Other Ambulatory Visit: Payer: Self-pay | Admitting: Orthopaedic Surgery

## 2018-09-17 DIAGNOSIS — M48062 Spinal stenosis, lumbar region with neurogenic claudication: Secondary | ICD-10-CM

## 2018-10-03 ENCOUNTER — Other Ambulatory Visit: Payer: Medicaid Other

## 2018-10-03 ENCOUNTER — Ambulatory Visit: Payer: Medicare HMO | Admitting: Orthopedic Surgery

## 2018-10-16 ENCOUNTER — Other Ambulatory Visit: Payer: Medicaid Other

## 2018-10-22 ENCOUNTER — Ambulatory Visit: Payer: Medicare HMO | Admitting: Orthopedic Surgery

## 2018-11-10 ENCOUNTER — Ambulatory Visit (INDEPENDENT_AMBULATORY_CARE_PROVIDER_SITE_OTHER): Payer: Medicare HMO | Admitting: Orthopedic Surgery

## 2018-11-10 ENCOUNTER — Encounter: Payer: Self-pay | Admitting: Orthopedic Surgery

## 2018-11-10 ENCOUNTER — Ambulatory Visit (INDEPENDENT_AMBULATORY_CARE_PROVIDER_SITE_OTHER): Payer: Medicare HMO

## 2018-11-10 ENCOUNTER — Other Ambulatory Visit: Payer: Self-pay

## 2018-11-10 VITALS — BP 126/69 | HR 87 | Temp 97.4°F | Ht 63.0 in | Wt 146.0 lb

## 2018-11-10 DIAGNOSIS — G8929 Other chronic pain: Secondary | ICD-10-CM

## 2018-11-10 DIAGNOSIS — M5441 Lumbago with sciatica, right side: Secondary | ICD-10-CM | POA: Diagnosis not present

## 2018-11-10 DIAGNOSIS — M25561 Pain in right knee: Secondary | ICD-10-CM

## 2018-11-10 MED ORDER — HYDROCODONE-ACETAMINOPHEN 5-325 MG PO TABS: 1.0000 | ORAL_TABLET | Freq: Four times a day (QID) | ORAL | 0 refills | Status: DC | PRN

## 2018-11-10 NOTE — Progress Notes (Signed)
Progress Note   Patient ID: Amber Cunningham, female   DOB: 04-07-1961, 58 y.o.   MRN: [id number]   Chief Complaint  Patient presents with  . Knee Pain    58 year old female status post knee scope has osteoarthritis right knee and chronic lower back pain with radicular symptoms in the right leg being followed by Dr. Patrice Paradise  She is on ibuprofen Pamelor and Effexor complaining of back and knee pain with pain popping in the lateral joint line limping and decreased range of motion with some activities of daily living causing problems such as getting up and down and climbing stairs   Surgical report  03/14/2017  10:07 AM  PATIENT:  Amber Cunningham  58 y.o. 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  Neurological: Positive for tingling.   Current Meds  Medication Sig  . Calcium Citrate-Vitamin D (CALCIUM + D PO) Take 1 tablet by mouth daily.   Marland Kitchen estradiol (ESTRACE) 1 MG tablet Take 1 mg by mouth daily.  Marland Kitchen ibuprofen (ADVIL,MOTRIN) 800 MG tablet Take 800 mg by mouth every 8 (eight) hours as needed for mild pain or moderate pain.  . mirtazapine (REMERON) 30 MG tablet Take 30 mg by mouth at bedtime.  . Multiple Vitamin (MULTIVITAMIN) tablet Take 1 tablet by mouth daily.  . nortriptyline (PAMELOR) 10  MG capsule Take 1 capsule (10 mg total) by mouth at bedtime.  Marland Kitchen omeprazole (PRILOSEC) 40 MG capsule Take 40 mg by mouth daily.  . pravastatin (PRAVACHOL) 10 MG tablet Take 10 mg by mouth daily.  . valACYclovir (VALTREX) 500 MG tablet Take 500 mg by mouth 2 (two) times daily.  Marland Kitchen venlafaxine XR (EFFEXOR-XR) 150 MG 24 hr capsule Take 1 capsule by mouth every morning.  . venlafaxine XR (EFFEXOR-XR) 75 MG 24 hr capsule Take 75 mg by mouth daily with breakfast.     Past Medical History:  Diagnosis Date  . Anxiety   . Arthritis   . Depression   . GERD (gastroesophageal reflux disease)   . History of kidney stones   . Hypercholesteremia      Allergies  Allergen Reactions  . Gabapentin Other (See Comments)    Blurry vision  . Percocet [Oxycodone-Acetaminophen] Other (See Comments)    Makes pt feel like she will have a heart attack  . Prazosin Other (See Comments)    Stops pt heart  . Levofloxacin Rash     BP 126/69   Pulse 87   Ht 5\' 3"  (1.6 m)   Wt 146 lb (66.2 kg)   BMI 25.86 kg/m    Physical Exam General appearance normal Oriented x3 normal Mood pleasant affect normal Gait antalgic  Ortho Exam Left leg and knee  Inspection and palpation revealed no abnormalities Range of motion is full No instability was detected on stress testing Muscle tone and strength was normal without tremor Skin was warm dry and intact  Good pulse and temperature were noted in the extremity Sensation revealed no abnormalities to light touch  Tenderness in the peripatellar region with hypersensitivity concerning in terms of doing surgery on her knee Flexion 90 degrees valgus alignment All ligaments are stable Muscle strength and tone are normal Skin shows no rash or ulceration Pulse and temperature are normal  Antalgic gait  Right knee range of motion full strength normal ligaments stable skin intact pulse and perfusion normal  She did have some tenderness along the anterior  compartment of the right leg in the L5 distribution   MEDICAL DECISION MAKING   Imaging:  Today the x-ray shows valgus knee 10 degrees secondary bone changes osteophytes mild joint space narrowing with patellofemoral arthritis as well   Encounter Diagnoses  Name Primary?  . Chronic pain of right knee Yes  . Chronic low back pain with right-sided sciatica, unspecified back pain laterality    Impression: Patient is scheduled for MRI May 22 with Dr. Rennis Harding  To evaluate her lumbar spine  She had an x-ray done recently which showed Radiographs of the lumbar spine   Right lower back pain radiating to right leg   Patient has slight increase in lumbar lordosis truncal asymmetry with a shift to the left indicating some type of pathology in the right lower back   Subtle spondylolysis L5-S1   She has some anterior lipping of L3-L4 L1 and what appears to be increased sclerosis and joint space narrowing facet joints L3-4 L4-5 L5-S1   Lumbar spondylosis   PLAN: (RX., injection, surgery,frx,mri/ct, XR 2 body ares) REC:  rt tka once her L spine has been treated.   Wants pain medication for her back   Temporary rx for norco 5 mg q6 prn x 1   Follow-up in June recheck knee  Meds ordered this encounter  Medications  . HYDROcodone-acetaminophen (NORCO/VICODIN) 5-325 MG tablet    Sig: Take 1 tablet by mouth every 6 (six) hours as needed for moderate pain.    Dispense:  30 tablet    Refill:  0   10:20 AM 11/10/2018

## 2018-12-05 ENCOUNTER — Other Ambulatory Visit: Payer: Medicaid Other

## 2018-12-17 ENCOUNTER — Ambulatory Visit: Payer: Self-pay | Admitting: Orthopedic Surgery

## 2018-12-18 ENCOUNTER — Ambulatory Visit
Admission: RE | Admit: 2018-12-18 | Discharge: 2018-12-18 | Disposition: A | Discharge status: Home / Self Care | Payer: Medicare Other | Source: Ambulatory Visit | Attending: Orthopaedic Surgery | Admitting: Orthopaedic Surgery

## 2018-12-18 ENCOUNTER — Other Ambulatory Visit: Payer: Self-pay

## 2018-12-18 DIAGNOSIS — M48061 Spinal stenosis, lumbar region without neurogenic claudication: Secondary | ICD-10-CM | POA: Diagnosis not present

## 2018-12-18 DIAGNOSIS — M48062 Spinal stenosis, lumbar region with neurogenic claudication: Secondary | ICD-10-CM

## 2018-12-19 DIAGNOSIS — M791 Myalgia, unspecified site: Secondary | ICD-10-CM | POA: Diagnosis not present

## 2018-12-19 DIAGNOSIS — M48062 Spinal stenosis, lumbar region with neurogenic claudication: Secondary | ICD-10-CM | POA: Diagnosis not present

## 2018-12-19 DIAGNOSIS — M5136 Other intervertebral disc degeneration, lumbar region: Secondary | ICD-10-CM | POA: Diagnosis not present

## 2018-12-19 DIAGNOSIS — M47896 Other spondylosis, lumbar region: Secondary | ICD-10-CM | POA: Diagnosis not present

## 2018-12-22 ENCOUNTER — Ambulatory Visit: Payer: Medicare Other | Admitting: Orthopedic Surgery

## 2018-12-24 ENCOUNTER — Other Ambulatory Visit: Payer: Self-pay

## 2018-12-24 ENCOUNTER — Ambulatory Visit (INDEPENDENT_AMBULATORY_CARE_PROVIDER_SITE_OTHER): Payer: Medicare Other | Admitting: Orthopedic Surgery

## 2018-12-24 ENCOUNTER — Encounter: Payer: Self-pay | Admitting: Orthopedic Surgery

## 2018-12-24 VITALS — BP 159/81 | HR 79 | Temp 97.2°F | Ht 63.0 in | Wt 146.0 lb

## 2018-12-24 DIAGNOSIS — G8929 Other chronic pain: Secondary | ICD-10-CM | POA: Diagnosis not present

## 2018-12-24 DIAGNOSIS — Z9889 Other specified postprocedural states: Secondary | ICD-10-CM | POA: Diagnosis not present

## 2018-12-24 DIAGNOSIS — M5441 Lumbago with sciatica, right side: Secondary | ICD-10-CM

## 2018-12-24 DIAGNOSIS — M25461 Effusion, right knee: Secondary | ICD-10-CM | POA: Diagnosis not present

## 2018-12-24 DIAGNOSIS — M25561 Pain in right knee: Secondary | ICD-10-CM | POA: Diagnosis not present

## 2018-12-24 NOTE — Progress Notes (Addendum)
Chief Complaint  Patient presents with  . Knee Pain    right . better after toradol injection by Dr Patrice Paradise    58 year old female status post lumbar decompression status post arthroscopy right knee recently had MRI followed by injection of Toradol into the spine currently on meloxicam instead of ibuprofen and not on any opioids.  She said she got good relief including pain relief in her knee  X-ray in April showed symmetric joint space narrowing some secondary bone changes and 10 degrees of valgus in terms of alignment  She says she is really good right now and does not want to proceed with any surgery  Review of systems: Patient has some dental issues and she would like to have those teeth removed at this time  Had mri  L1-2: Normal.   L2-3 small left subarticular disc protrusion mildly narrowing the left lateral recess. No spinal canal or neural foraminal stenosis.   L3-4: Status post posterior decompression without residual spinal canal stenosis. Mild bilateral neural foraminal stenosis, left-greater-than-right.   L4-5: Posterior decompression without spinal canal stenosis. Moderate facet hypertrophy. Small disc bulge with central annular fissure. No neural foraminal stenosis.   L5-S1: Posterior decompression. No spinal canal stenosis. Mild disc bulge with endplate spurring. Mild bilateral neural foraminal stenosis.   IMPRESSION: 1. Postsurgical changes of the lower lumbar spine with posterior decompression and no spinal canal stenosis. 2. L5-S1 mild bilateral neural foraminal stenosis. 3. L3-4 mild bilateral neural foraminal stenosis. 4. L2-3 mild left lateral recess narrowing.    Physical Exam Vitals signs and nursing note reviewed.  Constitutional:      Appearance: Normal appearance.  Musculoskeletal:       Legs:  Neurological:     Mental Status: She is alert and oriented to person, place, and time.  Psychiatric:        Mood and Affect: Mood normal.     Encounter Diagnoses  Name Primary?  . Chronic pain of right knee Yes  . Chronic low back pain with right-sided sciatica, unspecified back pain laterality   . S/P arthroscopy of right knee  03/14/2017   . Effusion of right knee    Chronic pain right knee status post knee arthroscopy with effusion.  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 Manon Hilding in 6 months repeat x-ray right knee

## 2018-12-24 NOTE — Addendum Note (Signed)
Addended by: [name] on: 12/24/2018 03:29 PM   Modules accepted: Level of Service

## 2018-12-25 DIAGNOSIS — M542 Cervicalgia: Secondary | ICD-10-CM | POA: Diagnosis not present

## 2018-12-25 DIAGNOSIS — M5136 Other intervertebral disc degeneration, lumbar region: Secondary | ICD-10-CM | POA: Diagnosis not present

## 2018-12-25 DIAGNOSIS — M48062 Spinal stenosis, lumbar region with neurogenic claudication: Secondary | ICD-10-CM | POA: Diagnosis not present

## 2019-01-13 DIAGNOSIS — M48062 Spinal stenosis, lumbar region with neurogenic claudication: Secondary | ICD-10-CM | POA: Diagnosis not present

## 2019-02-04 DIAGNOSIS — F331 Major depressive disorder, recurrent, moderate: Secondary | ICD-10-CM | POA: Diagnosis not present

## 2019-02-25 DIAGNOSIS — M545 Low back pain: Secondary | ICD-10-CM | POA: Diagnosis not present

## 2019-02-25 DIAGNOSIS — Z6825 Body mass index (BMI) 25.0-25.9, adult: Secondary | ICD-10-CM | POA: Diagnosis not present

## 2019-02-25 DIAGNOSIS — M48062 Spinal stenosis, lumbar region with neurogenic claudication: Secondary | ICD-10-CM | POA: Diagnosis not present

## 2019-02-25 DIAGNOSIS — M791 Myalgia, unspecified site: Secondary | ICD-10-CM | POA: Diagnosis not present

## 2019-02-25 DIAGNOSIS — M47896 Other spondylosis, lumbar region: Secondary | ICD-10-CM | POA: Diagnosis not present

## 2019-03-06 DIAGNOSIS — Z1211 Encounter for screening for malignant neoplasm of colon: Secondary | ICD-10-CM | POA: Diagnosis not present

## 2019-03-06 DIAGNOSIS — Z7189 Other specified counseling: Secondary | ICD-10-CM | POA: Diagnosis not present

## 2019-03-06 DIAGNOSIS — Z6826 Body mass index (BMI) 26.0-26.9, adult: Secondary | ICD-10-CM | POA: Diagnosis not present

## 2019-03-06 DIAGNOSIS — I10 Essential (primary) hypertension: Secondary | ICD-10-CM | POA: Diagnosis not present

## 2019-03-06 DIAGNOSIS — Z0000 Encounter for general adult medical examination without abnormal findings: Secondary | ICD-10-CM | POA: Diagnosis not present

## 2019-03-06 DIAGNOSIS — Z299 Encounter for prophylactic measures, unspecified: Secondary | ICD-10-CM | POA: Diagnosis not present

## 2019-03-06 DIAGNOSIS — Z1339 Encounter for screening examination for other mental health and behavioral disorders: Secondary | ICD-10-CM | POA: Diagnosis not present

## 2019-03-06 DIAGNOSIS — Z1331 Encounter for screening for depression: Secondary | ICD-10-CM | POA: Diagnosis not present

## 2019-03-12 DIAGNOSIS — M545 Low back pain: Secondary | ICD-10-CM | POA: Diagnosis not present

## 2019-03-12 DIAGNOSIS — M47896 Other spondylosis, lumbar region: Secondary | ICD-10-CM | POA: Diagnosis not present

## 2019-03-12 DIAGNOSIS — R262 Difficulty in walking, not elsewhere classified: Secondary | ICD-10-CM | POA: Diagnosis not present

## 2019-03-17 DIAGNOSIS — R262 Difficulty in walking, not elsewhere classified: Secondary | ICD-10-CM | POA: Diagnosis not present

## 2019-03-17 DIAGNOSIS — M47896 Other spondylosis, lumbar region: Secondary | ICD-10-CM | POA: Diagnosis not present

## 2019-03-18 DIAGNOSIS — F331 Major depressive disorder, recurrent, moderate: Secondary | ICD-10-CM | POA: Diagnosis not present

## 2019-03-31 DIAGNOSIS — M47896 Other spondylosis, lumbar region: Secondary | ICD-10-CM | POA: Diagnosis not present

## 2019-03-31 DIAGNOSIS — R262 Difficulty in walking, not elsewhere classified: Secondary | ICD-10-CM | POA: Diagnosis not present

## 2019-04-02 DIAGNOSIS — R262 Difficulty in walking, not elsewhere classified: Secondary | ICD-10-CM | POA: Diagnosis not present

## 2019-04-02 DIAGNOSIS — M47896 Other spondylosis, lumbar region: Secondary | ICD-10-CM | POA: Diagnosis not present

## 2019-04-07 DIAGNOSIS — F419 Anxiety disorder, unspecified: Secondary | ICD-10-CM | POA: Diagnosis not present

## 2019-04-07 DIAGNOSIS — L600 Ingrowing nail: Secondary | ICD-10-CM | POA: Diagnosis not present

## 2019-04-07 DIAGNOSIS — Z6826 Body mass index (BMI) 26.0-26.9, adult: Secondary | ICD-10-CM | POA: Diagnosis not present

## 2019-04-07 DIAGNOSIS — Z79899 Other long term (current) drug therapy: Secondary | ICD-10-CM | POA: Diagnosis not present

## 2019-04-07 DIAGNOSIS — F418 Other specified anxiety disorders: Secondary | ICD-10-CM | POA: Diagnosis not present

## 2019-04-07 DIAGNOSIS — E7800 Pure hypercholesterolemia, unspecified: Secondary | ICD-10-CM | POA: Diagnosis not present

## 2019-04-07 DIAGNOSIS — Z299 Encounter for prophylactic measures, unspecified: Secondary | ICD-10-CM | POA: Diagnosis not present

## 2019-04-07 DIAGNOSIS — I10 Essential (primary) hypertension: Secondary | ICD-10-CM | POA: Diagnosis not present

## 2019-04-07 DIAGNOSIS — Z0000 Encounter for general adult medical examination without abnormal findings: Secondary | ICD-10-CM | POA: Diagnosis not present

## 2019-04-10 DIAGNOSIS — S4361XA Sprain of right sternoclavicular joint, initial encounter: Secondary | ICD-10-CM | POA: Diagnosis not present

## 2019-04-10 DIAGNOSIS — M791 Myalgia, unspecified site: Secondary | ICD-10-CM | POA: Diagnosis not present

## 2019-04-10 DIAGNOSIS — R0789 Other chest pain: Secondary | ICD-10-CM | POA: Diagnosis not present

## 2019-04-10 DIAGNOSIS — M545 Low back pain: Secondary | ICD-10-CM | POA: Diagnosis not present

## 2019-04-10 DIAGNOSIS — M48062 Spinal stenosis, lumbar region with neurogenic claudication: Secondary | ICD-10-CM | POA: Diagnosis not present

## 2019-04-27 ENCOUNTER — Ambulatory Visit: Payer: Medicare Other | Admitting: Orthopedic Surgery

## 2019-05-05 DIAGNOSIS — M79675 Pain in left toe(s): Secondary | ICD-10-CM | POA: Diagnosis not present

## 2019-05-05 DIAGNOSIS — L03032 Cellulitis of left toe: Secondary | ICD-10-CM | POA: Diagnosis not present

## 2019-05-13 ENCOUNTER — Ambulatory Visit: Payer: Medicare Other | Admitting: Orthopedic Surgery

## 2019-05-13 DIAGNOSIS — F331 Major depressive disorder, recurrent, moderate: Secondary | ICD-10-CM | POA: Diagnosis not present

## 2019-05-18 ENCOUNTER — Ambulatory Visit: Payer: Medicare Other | Admitting: Orthopedic Surgery

## 2019-05-26 DIAGNOSIS — M79675 Pain in left toe(s): Secondary | ICD-10-CM | POA: Diagnosis not present

## 2019-05-26 DIAGNOSIS — L03032 Cellulitis of left toe: Secondary | ICD-10-CM | POA: Diagnosis not present

## 2019-06-04 DIAGNOSIS — D2370 Other benign neoplasm of skin of unspecified lower limb, including hip: Secondary | ICD-10-CM | POA: Diagnosis not present

## 2019-06-04 DIAGNOSIS — L639 Alopecia areata, unspecified: Secondary | ICD-10-CM | POA: Diagnosis not present

## 2019-06-19 DIAGNOSIS — Z23 Encounter for immunization: Secondary | ICD-10-CM | POA: Diagnosis not present

## 2019-07-08 DIAGNOSIS — F331 Major depressive disorder, recurrent, moderate: Secondary | ICD-10-CM | POA: Diagnosis not present

## 2019-07-22 ENCOUNTER — Other Ambulatory Visit: Payer: Self-pay | Admitting: Orthopedic Surgery

## 2019-07-22 NOTE — Telephone Encounter (Signed)
Patient called and rescheduled her appointment that was canceled in November.  She wants refill on Ibuprofen 800 mgs. Until she can come in on 08/03/19.  She states she uses [facility] representative in [place]

## 2019-07-23 MED ORDER — IBUPROFEN 800 MG PO TABS: 800.0000 mg | ORAL_TABLET | Freq: Three times a day (TID) | ORAL | 5 refills | Status: AC | PRN

## 2019-08-03 ENCOUNTER — Ambulatory Visit: Payer: Medicare Other | Admitting: Orthopedic Surgery

## 2019-08-10 ENCOUNTER — Ambulatory Visit: Payer: Medicare Other | Admitting: Orthopedic Surgery

## 2019-08-21 ENCOUNTER — Ambulatory Visit: Payer: Medicare Other | Admitting: Orthopedic Surgery

## 2019-08-24 ENCOUNTER — Other Ambulatory Visit: Payer: Self-pay

## 2019-08-24 ENCOUNTER — Ambulatory Visit (INDEPENDENT_AMBULATORY_CARE_PROVIDER_SITE_OTHER): Payer: Medicare Other | Admitting: Orthopedic Surgery

## 2019-08-24 ENCOUNTER — Ambulatory Visit: Payer: Medicare Other

## 2019-08-24 VITALS — Temp 96.9°F | Ht 63.0 in | Wt 146.0 lb

## 2019-08-24 DIAGNOSIS — M5441 Lumbago with sciatica, right side: Secondary | ICD-10-CM | POA: Diagnosis not present

## 2019-08-24 DIAGNOSIS — G8929 Other chronic pain: Secondary | ICD-10-CM

## 2019-08-24 DIAGNOSIS — M25561 Pain in right knee: Secondary | ICD-10-CM

## 2019-08-24 MED ORDER — NORTRIPTYLINE HCL 10 MG PO CAPS: 10.0000 mg | ORAL_CAPSULE | Freq: Every day | ORAL | 0 refills | Status: DC

## 2019-08-24 NOTE — Progress Notes (Signed)
Progress Note   Patient ID: JAILEI FREILICH, female   DOB: 02-Sep-1960, 59 y.o.   MRN: [id number]  Body mass index is 25.86 kg/m.  Chief Complaint  Patient presents with  . Follow-up    Right knee, DOS 03-14-17    Encounter Diagnosis  Name Primary?  . Chronic pain of right knee Yes   No orders of the defined types were placed in this encounter.   Medications are ibuprofen 800, 3  times a day Remeron 30 mg multivitamin Prilosec 40 pravastatin 10 mg Valtrex 500 mg Effexor 150 mg +75 mg   59 year old female status post arthroscopy right knee has osteoarthritis of the lateral compartment has right knee pain but also has residual right lower extremity radicular pain status post lumbar surgery  She complains of pain at night pain runs down her leg from the hip to the foot with localization of pain into the right knee joint  She is currently on ibuprofen she has been on meloxicam in the past as well as hydrocodone.  I think the hydrocodone helps her back and leg pain generally although it may not relieve all of her arthritic symptoms  She did not tolerate gabapentin    Review of Systems  Musculoskeletal: Positive for back pain.   Temp (!) 96.9 F (36.1 C)   Ht 5\' 3"  (1.6 m)   Wt 146 lb (66.2 kg)   BMI 25.86 kg/m   Physical Exam  Vitals are stable weight is good Normal body habitus Alert and oriented x3 Mood pleasant affect flat  Decent gait pattern Right knee Mild effusion Tenderness lateral joint line Arc of motion 125 degrees Collateral ligaments and cruciate ligaments are stable Knee is in slight valgus Back is tender   MEDICAL DECISION MAKING  A.  Encounter Diagnosis  Name Primary?  . Chronic pain of right knee Yes    B. DATA ANALYSED:  IMAGING: Independent interpretation of images: No new imaging Outside records reviewed: No outside orders or records reviewed  C. MANAGEMENT  Meds ordered this encounter  Medications  . nortriptyline (PAMELOR) 10  MG capsule    Sig: Take 1 capsule (10 mg total) by mouth at bedtime.    Dispense:  30 capsule    Refill:  0    Procedure note right knee injection   verbal consent was obtained to inject right knee joint  Timeout was completed to confirm the site of injection  The medications used were 40 mg of Depo-Medrol and 1% lidocaine 3 cc  Anesthesia was provided by ethyl chloride and the skin was prepped with alcohol.  After cleaning the skin with alcohol a 20-gauge needle was used to inject the right knee joint. There were no complications. A sterile bandage was applied.   I think her symptoms are lumbar related with radicular pain she has enough cartilage in her knee at 59 to continue at least 3 years probably  Follow-up in 6 months  No orders of the defined types were placed in this encounter.   [name], MD 08/24/2019 4:59 PM

## 2019-08-24 NOTE — Patient Instructions (Signed)
Start new medication   See Dr Patrice Paradise , the leg pain is coming from the back   The knee pain is from arthritis   You have received an injection of steroids into the joint. 15% of patients will have increased pain within the 24 hours postinjection.   This is transient and will go away.   We recommend that you use ice packs on the injection site for 20 minutes every 2 hours and extra strength Tylenol 2 tablets every 8 as needed until the pain resolves.  If you continue to have pain after taking the Tylenol and using the ice please call the office for further instructions.

## 2019-09-15 ENCOUNTER — Other Ambulatory Visit: Payer: Self-pay

## 2019-09-15 ENCOUNTER — Ambulatory Visit: Payer: Medicare Other | Attending: Internal Medicine

## 2019-09-15 DIAGNOSIS — Z23 Encounter for immunization: Secondary | ICD-10-CM

## 2019-09-15 NOTE — Progress Notes (Signed)
   Covid-19 Vaccination Clinic  Name:  Amber Cunningham    MRN: [id number] DOB: 1960-09-30  09/15/2019  [name] was observed post Covid-19 immunization for 15 minutes without incident. She was provided with Vaccine Information Sheet and instruction to access the V-Safe system.   [name] was instructed to call 911 with any severe reactions post vaccine: Marland Kitchen Difficulty breathing  . Swelling of face and throat  . A fast heartbeat  . A bad rash all over body  . Dizziness and weakness   Immunizations Administered    Name Date Dose VIS Date Route   Moderna COVID-19 Vaccine 09/15/2019  4:26 PM 0.5 mL 06/16/2019 Intramuscular   Manufacturer: Moderna   Lot: [id number]   [id number]

## 2019-09-21 ENCOUNTER — Other Ambulatory Visit: Payer: Self-pay | Admitting: Orthopedic Surgery

## 2019-09-21 DIAGNOSIS — M5441 Lumbago with sciatica, right side: Secondary | ICD-10-CM

## 2019-09-21 DIAGNOSIS — G8929 Other chronic pain: Secondary | ICD-10-CM

## 2019-10-07 DIAGNOSIS — F331 Major depressive disorder, recurrent, moderate: Secondary | ICD-10-CM | POA: Diagnosis not present

## 2019-10-13 ENCOUNTER — Ambulatory Visit: Payer: Medicare Other | Attending: Internal Medicine

## 2019-10-13 DIAGNOSIS — Z23 Encounter for immunization: Secondary | ICD-10-CM

## 2019-10-13 NOTE — Progress Notes (Signed)
   Covid-19 Vaccination Clinic  Name:  Amber Cunningham    MRN: [id number] DOB: 1960/12/18  10/13/2019  [name] was observed post Covid-19 immunization for 15 minutes without incident. She was provided with Vaccine Information Sheet and instruction to access the V-Safe system.   [name] was instructed to call 911 with any severe reactions post vaccine: Marland Kitchen Difficulty breathing  . Swelling of face and throat  . A fast heartbeat  . A bad rash all over body  . Dizziness and weakness   Immunizations Administered    Name Date Dose VIS Date Route   Moderna COVID-19 Vaccine 10/13/2019  1:53 PM 0.5 mL 06/16/2019 Intramuscular   Manufacturer: Moderna   Lot: [id number]   [id number]

## 2019-10-25 ENCOUNTER — Other Ambulatory Visit: Payer: Self-pay | Admitting: Orthopedic Surgery

## 2019-10-25 DIAGNOSIS — G8929 Other chronic pain: Secondary | ICD-10-CM

## 2019-11-04 DIAGNOSIS — F331 Major depressive disorder, recurrent, moderate: Secondary | ICD-10-CM | POA: Diagnosis not present

## 2019-11-10 DIAGNOSIS — L909 Atrophic disorder of skin, unspecified: Secondary | ICD-10-CM | POA: Diagnosis not present

## 2019-11-23 ENCOUNTER — Other Ambulatory Visit: Payer: Self-pay | Admitting: Orthopedic Surgery

## 2019-11-23 DIAGNOSIS — M5441 Lumbago with sciatica, right side: Secondary | ICD-10-CM

## 2019-11-23 DIAGNOSIS — G8929 Other chronic pain: Secondary | ICD-10-CM

## 2019-12-02 DIAGNOSIS — F331 Major depressive disorder, recurrent, moderate: Secondary | ICD-10-CM | POA: Diagnosis not present

## 2019-12-11 DIAGNOSIS — F331 Major depressive disorder, recurrent, moderate: Secondary | ICD-10-CM | POA: Diagnosis not present

## 2019-12-11 DIAGNOSIS — Z299 Encounter for prophylactic measures, unspecified: Secondary | ICD-10-CM | POA: Diagnosis not present

## 2019-12-11 DIAGNOSIS — M25611 Stiffness of right shoulder, not elsewhere classified: Secondary | ICD-10-CM | POA: Diagnosis not present

## 2019-12-11 DIAGNOSIS — I10 Essential (primary) hypertension: Secondary | ICD-10-CM | POA: Diagnosis not present

## 2019-12-30 DIAGNOSIS — F331 Major depressive disorder, recurrent, moderate: Secondary | ICD-10-CM | POA: Diagnosis not present

## 2020-01-19 DIAGNOSIS — I10 Essential (primary) hypertension: Secondary | ICD-10-CM | POA: Diagnosis not present

## 2020-01-19 DIAGNOSIS — F418 Other specified anxiety disorders: Secondary | ICD-10-CM | POA: Diagnosis not present

## 2020-01-19 DIAGNOSIS — F331 Major depressive disorder, recurrent, moderate: Secondary | ICD-10-CM | POA: Diagnosis not present

## 2020-01-19 DIAGNOSIS — E7800 Pure hypercholesterolemia, unspecified: Secondary | ICD-10-CM | POA: Diagnosis not present

## 2020-01-19 DIAGNOSIS — Z299 Encounter for prophylactic measures, unspecified: Secondary | ICD-10-CM | POA: Diagnosis not present

## 2020-01-19 DIAGNOSIS — Z789 Other specified health status: Secondary | ICD-10-CM | POA: Diagnosis not present

## 2020-02-04 ENCOUNTER — Ambulatory Visit: Payer: Medicaid Other | Admitting: Orthopaedic Surgery

## 2020-02-04 ENCOUNTER — Other Ambulatory Visit: Payer: Self-pay

## 2020-02-08 DIAGNOSIS — Z01419 Encounter for gynecological examination (general) (routine) without abnormal findings: Secondary | ICD-10-CM | POA: Diagnosis not present

## 2020-02-11 ENCOUNTER — Other Ambulatory Visit: Payer: Self-pay

## 2020-02-11 ENCOUNTER — Ambulatory Visit: Payer: Self-pay

## 2020-02-11 ENCOUNTER — Ambulatory Visit (INDEPENDENT_AMBULATORY_CARE_PROVIDER_SITE_OTHER): Payer: Medicare HMO | Admitting: Orthopaedic Surgery

## 2020-02-11 ENCOUNTER — Encounter: Payer: Self-pay | Admitting: Orthopaedic Surgery

## 2020-02-11 VITALS — BP 151/96 | HR 70 | Ht 63.0 in | Wt 148.0 lb

## 2020-02-11 DIAGNOSIS — M25512 Pain in left shoulder: Secondary | ICD-10-CM | POA: Diagnosis not present

## 2020-02-11 DIAGNOSIS — G8929 Other chronic pain: Secondary | ICD-10-CM

## 2020-02-11 DIAGNOSIS — M25511 Pain in right shoulder: Secondary | ICD-10-CM

## 2020-02-11 DIAGNOSIS — M7542 Impingement syndrome of left shoulder: Secondary | ICD-10-CM

## 2020-02-11 NOTE — Progress Notes (Signed)
Office Visit Note   Patient: Amber Cunningham           Date of Birth: Nov 29, 1960           MRN: [id number] Visit Date: 02/11/2020              Requested by: Glenda Chroman, MD [place],  [place] [address] PCP: Glenda Chroman, MD   Assessment & Plan: Visit Diagnoses:  1. Chronic pain of both shoulders   2. Impingement syndrome of left shoulder   3.     Right sternoclavicular arthritis  Plan: Subacromial injection performed left shoulder with good relief of pain.  She can use some Aspercreme over her right sternoclavicular joint.  She has continued symptoms or problems with her left shoulder she will call about diagnostic MRI imaging.  Follow-Up Instructions: No follow-ups on file.   Orders:  Orders Placed This Encounter  Procedures  . XR Clavicle Right   No orders of the defined types were placed in this encounter.     Procedures: Large Joint Inj: L subacromial bursa on 02/12/2020 8:56 AM Indications: pain Details: 22 G 1.5 in needle  Arthrogram: No  Medications: 4 mL bupivacaine 0.25 %; 40 mg methylPREDNISolone acetate 40 MG/ML; 0.5 mL lidocaine 1 % Outcome: tolerated well, no immediate complications Procedure, treatment alternatives, risks and benefits explained, specific risks discussed. Consent was given by the patient. Immediately prior to procedure a time out was called to verify the correct patient, procedure, equipment, support staff and site/side marked as required. Patient was prepped and draped in the usual sterile fashion.       Clinical Data: No additional findings.   Subjective: Chief Complaint  Patient presents with  . Right Shoulder - Pain    HPI 59 year old female seen with prominence of her right sternoclavicular joint which is painful and also left shoulder pain with outstretch reaching overhead activities.  She denies any specific injury to her clavicle on the right she does recall that she hit her head had a concussion 1 to 2 years ago  with CT scan of the brain done in the emergency room which was negative.  She states she started to notice a sternoclavicular joint after that fall.  She has some pain with lifting pushing and pulling.  She points to the left acromioclavicular joint with crossarm positioning as causing pain.  She can still get her arms up overhead fix her hair.  Review of Systems patient had previous cervical fusion lumbar decompression for stenosis done by Dr. Patrice Paradise.  Previous knee arthroscopy.  Positive for depression anxiety.  Otherwise 14 point systems noncontributory to HPI.   Objective: Vital Signs: BP (!) 151/96   Pulse 70   Ht 5\' 3"  (1.6 m)   Wt 148 lb (67.1 kg)   BMI 26.22 kg/m   Physical Exam Constitutional:      Appearance: She is well-developed.  HENT:     Head: Normocephalic.     Right Ear: External ear normal.     Left Ear: External ear normal.  Eyes:     Pupils: Pupils are equal, round, and reactive to light.  Neck:     Thyroid: No thyromegaly.     Trachea: No tracheal deviation.  Cardiovascular:     Rate and Rhythm: Normal rate.  Pulmonary:     Effort: Pulmonary effort is normal.  Abdominal:     Palpations: Abdomen is soft.  Skin:    General: Skin is warm  and dry.  Neurological:     Mental Status: She is alert and oriented to person, place, and time.  Psychiatric:        Behavior: Behavior normal.     Ortho Exam patient has tenderness of the right sternoclavicular joint with prominence.  Tenderness with palpation extends to the mid clavicle.  Acromioclavicular joint on the right is normal but tender on the left.  Negative drop arm test left shoulder positive impingement she can actively get her arm up overhead.  No internal or external weakness.  Upper extremity reflexes are 2+.  Specialty Comments:  No specialty comments available.  Imaging: No results found.   PMFS History: Patient Active Problem List   Diagnosis Date Noted  . Impingement syndrome of left shoulder  02/12/2020  . S/P arthroscopy of right knee  03/14/2017 04/29/2017  . Derangement of posterior horn of medial meniscus of right knee   . 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 Chondromalacia, patella, right   . Chondromalacia of lateral condyle of right femur    Past Medical History:  Diagnosis Date  . Anxiety   . Arthritis   . Depression   . GERD (gastroesophageal reflux disease)   . History of kidney stones   . Hypercholesteremia     Family History  Problem Relation Age of Onset  . COPD Mother   . Diabetes Mother   . Seizures Brother   . Clotting disorder Brother     Past Surgical History:  Procedure Laterality Date  . ABDOMINAL HYSTERECTOMY    . CARPAL TUNNEL RELEASE Bilateral   . 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  Marland Kitchen KIDNEY STONE EXTRACTION    . KNEE ARTHROSCOPY WITH LATERAL MENISECTOMY Right 03/14/2017   Procedure: KNEE ARTHROSCOPY WITH LATERAL AND MEDIAL MENISECTOMY;  Surgeon: [name], MD;  Location: [facility];  Service: Orthopedics;  Laterality: Right;  . NECK SURGERY     plate and screws in neck   Social History   Occupational History  . Not on file  Tobacco Use  . Smoking status: Never Smoker  . Smokeless tobacco: Never Used  Vaping Use  . Vaping Use: Never used  Substance and Sexual Activity  . Alcohol use: No  . Drug use: No  . Sexual activity: Not Currently    Birth control/protection: Surgical

## 2020-02-12 DIAGNOSIS — M7542 Impingement syndrome of left shoulder: Secondary | ICD-10-CM | POA: Insufficient documentation

## 2020-02-12 DIAGNOSIS — M25511 Pain in right shoulder: Secondary | ICD-10-CM | POA: Diagnosis not present

## 2020-02-12 DIAGNOSIS — G8929 Other chronic pain: Secondary | ICD-10-CM | POA: Diagnosis not present

## 2020-02-12 MED ORDER — METHYLPREDNISOLONE ACETATE 40 MG/ML IJ SUSP
40.0000 mg | INTRAMUSCULAR | Status: AC | PRN
Start: 2020-02-12 — End: 2020-02-12
  Administered 2020-02-12: 40 mg via INTRA_ARTICULAR

## 2020-02-12 MED ORDER — BUPIVACAINE HCL 0.25 % IJ SOLN
4.0000 mL | INTRAMUSCULAR | Status: AC | PRN
Start: 2020-02-12 — End: 2020-02-12
  Administered 2020-02-12: 4 mL via INTRA_ARTICULAR

## 2020-02-12 MED ORDER — LIDOCAINE HCL 1 % IJ SOLN
0.5000 mL | INTRAMUSCULAR | Status: AC | PRN
Start: 2020-02-12 — End: 2020-02-12
  Administered 2020-02-12: .5 mL

## 2020-02-22 ENCOUNTER — Ambulatory Visit: Payer: Medicare Other | Admitting: Orthopedic Surgery

## 2020-02-25 ENCOUNTER — Ambulatory Visit: Payer: Medicare Other | Admitting: Orthopedic Surgery

## 2020-02-25 ENCOUNTER — Encounter: Payer: Self-pay | Admitting: Orthopedic Surgery

## 2020-03-09 DIAGNOSIS — F331 Major depressive disorder, recurrent, moderate: Secondary | ICD-10-CM | POA: Diagnosis not present

## 2020-03-11 DIAGNOSIS — Z1339 Encounter for screening examination for other mental health and behavioral disorders: Secondary | ICD-10-CM | POA: Diagnosis not present

## 2020-03-11 DIAGNOSIS — E7800 Pure hypercholesterolemia, unspecified: Secondary | ICD-10-CM | POA: Diagnosis not present

## 2020-03-11 DIAGNOSIS — Z1331 Encounter for screening for depression: Secondary | ICD-10-CM | POA: Diagnosis not present

## 2020-03-11 DIAGNOSIS — Z0000 Encounter for general adult medical examination without abnormal findings: Secondary | ICD-10-CM | POA: Diagnosis not present

## 2020-03-11 DIAGNOSIS — Z299 Encounter for prophylactic measures, unspecified: Secondary | ICD-10-CM | POA: Diagnosis not present

## 2020-03-11 DIAGNOSIS — R5383 Other fatigue: Secondary | ICD-10-CM | POA: Diagnosis not present

## 2020-03-11 DIAGNOSIS — I10 Essential (primary) hypertension: Secondary | ICD-10-CM | POA: Diagnosis not present

## 2020-03-11 DIAGNOSIS — Z6826 Body mass index (BMI) 26.0-26.9, adult: Secondary | ICD-10-CM | POA: Diagnosis not present

## 2020-03-11 DIAGNOSIS — Z7189 Other specified counseling: Secondary | ICD-10-CM | POA: Diagnosis not present

## 2020-03-11 DIAGNOSIS — Z79899 Other long term (current) drug therapy: Secondary | ICD-10-CM | POA: Diagnosis not present

## 2020-03-15 DIAGNOSIS — F329 Major depressive disorder, single episode, unspecified: Secondary | ICD-10-CM | POA: Diagnosis not present

## 2020-03-15 DIAGNOSIS — E7849 Other hyperlipidemia: Secondary | ICD-10-CM | POA: Diagnosis not present

## 2020-04-08 DIAGNOSIS — E8940 Asymptomatic postprocedural ovarian failure: Secondary | ICD-10-CM | POA: Diagnosis not present

## 2020-05-18 DIAGNOSIS — F331 Major depressive disorder, recurrent, moderate: Secondary | ICD-10-CM | POA: Diagnosis not present

## 2020-06-03 DIAGNOSIS — Z23 Encounter for immunization: Secondary | ICD-10-CM | POA: Diagnosis not present

## 2020-06-14 DIAGNOSIS — I10 Essential (primary) hypertension: Secondary | ICD-10-CM | POA: Diagnosis not present

## 2020-06-14 DIAGNOSIS — E7849 Other hyperlipidemia: Secondary | ICD-10-CM | POA: Diagnosis not present

## 2020-06-14 DIAGNOSIS — F331 Major depressive disorder, recurrent, moderate: Secondary | ICD-10-CM | POA: Diagnosis not present

## 2020-06-28 DIAGNOSIS — I10 Essential (primary) hypertension: Secondary | ICD-10-CM | POA: Diagnosis not present

## 2020-06-28 DIAGNOSIS — F331 Major depressive disorder, recurrent, moderate: Secondary | ICD-10-CM | POA: Diagnosis not present

## 2020-06-28 DIAGNOSIS — Z299 Encounter for prophylactic measures, unspecified: Secondary | ICD-10-CM | POA: Diagnosis not present

## 2020-07-13 DIAGNOSIS — F331 Major depressive disorder, recurrent, moderate: Secondary | ICD-10-CM | POA: Diagnosis not present

## 2020-07-21 DIAGNOSIS — N958 Other specified menopausal and perimenopausal disorders: Secondary | ICD-10-CM | POA: Diagnosis not present

## 2020-07-21 DIAGNOSIS — N898 Other specified noninflammatory disorders of vagina: Secondary | ICD-10-CM | POA: Diagnosis not present

## 2020-07-29 DIAGNOSIS — F331 Major depressive disorder, recurrent, moderate: Secondary | ICD-10-CM | POA: Diagnosis not present

## 2020-07-29 DIAGNOSIS — Z299 Encounter for prophylactic measures, unspecified: Secondary | ICD-10-CM | POA: Diagnosis not present

## 2020-07-29 DIAGNOSIS — Z713 Dietary counseling and surveillance: Secondary | ICD-10-CM | POA: Diagnosis not present

## 2020-07-29 DIAGNOSIS — Z789 Other specified health status: Secondary | ICD-10-CM | POA: Diagnosis not present

## 2020-07-29 DIAGNOSIS — I10 Essential (primary) hypertension: Secondary | ICD-10-CM | POA: Diagnosis not present

## 2020-07-29 DIAGNOSIS — Z6824 Body mass index (BMI) 24.0-24.9, adult: Secondary | ICD-10-CM | POA: Diagnosis not present

## 2020-08-11 ENCOUNTER — Other Ambulatory Visit: Payer: Self-pay

## 2020-08-11 ENCOUNTER — Ambulatory Visit (INDEPENDENT_AMBULATORY_CARE_PROVIDER_SITE_OTHER): Payer: Medicare HMO | Admitting: Orthopaedic Surgery

## 2020-08-11 ENCOUNTER — Encounter: Payer: Self-pay | Admitting: Orthopaedic Surgery

## 2020-08-11 VITALS — Ht 63.0 in | Wt 140.0 lb

## 2020-08-11 DIAGNOSIS — M7542 Impingement syndrome of left shoulder: Secondary | ICD-10-CM | POA: Diagnosis not present

## 2020-08-11 NOTE — Progress Notes (Signed)
Office Visit Note   Patient: Amber Cunningham           Date of Birth: 1961-05-02           MRN: [id number] Visit Date: 08/11/2020              Requested by: Glenda Chroman, MD [place],  [place] [id number] PCP: Glenda Chroman, MD   Assessment & Plan: Visit Diagnoses:  1. Impingement syndrome of left shoulder     Plan: Subacromial injection performed with good relief of pain.  We discussed avoiding outstretched overhead activities for extended periods of time which likely is aggravating her shoulder problem.  She will return if she has persistent problems.  Follow-Up Instructions: No follow-ups on file.   Orders:  No orders of the defined types were placed in this encounter.  No orders of the defined types were placed in this encounter.     Procedures: No procedures performed   Clinical Data: No additional findings.   Subjective: Chief Complaint  Patient presents with  . Left Shoulder - Pain    HPI 60 year old female returns with recurrent left shoulder pain. She had previous injection last July and states it recurred and is significantly painful waking up at night she is having trouble getting dressed doing her hair. Initially did not recall any activities and on careful questioning she did states she had moved lots of objects in her house in the upper shelves to get them the way that she wanted and did this for extended period of time. She denies associated neck pain. Opposite shoulder is doing well. She is requesting a repeat left subacromial injection.  Review of Systems updated unchanged from 02/11/2020 office visit.   Objective: Vital Signs: Ht 5\' 3"  (1.6 m)   Wt 140 lb (63.5 kg)   BMI 24.80 kg/m   Physical Exam Constitutional:      Appearance: She is well-developed.  HENT:     Head: Normocephalic.     Right Ear: External ear normal.     Left Ear: External ear normal.  Eyes:     Pupils: Pupils are equal, round, and reactive to light.  Neck:      Thyroid: No thyromegaly.     Trachea: No tracheal deviation.  Cardiovascular:     Rate and Rhythm: Normal rate.  Pulmonary:     Effort: Pulmonary effort is normal.  Abdominal:     Palpations: Abdomen is soft.  Skin:    General: Skin is warm and dry.  Neurological:     Mental Status: She is alert and oriented to person, place, and time.  Psychiatric:        Mood and Affect: Mood and affect normal.        Behavior: Behavior normal.     Ortho Exam positive impingement left shoulder negative drop arm test.  Long head of the biceps is normal.  She has 50% abduction flexion limited by pain. Specialty Comments:  No specialty comments available.  Imaging: No results found.   PMFS History: Patient Active Problem List   Diagnosis Date Noted  . Impingement syndrome of left shoulder 02/12/2020  . S/P arthroscopy of right knee  03/14/2017 04/29/2017  . Derangement of posterior horn of medial meniscus of right knee   . 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 Chondromalacia, patella, right   . 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 Anxiety   . Arthritis   . Depression   . GERD (gastroesophageal reflux disease)   . History of kidney stones   . Hypercholesteremia     Family History  Problem Relation Age of Onset  . COPD Mother   . Diabetes Mother   . Seizures Brother   . Clotting disorder Brother     Past Surgical History:  Procedure Laterality Date  . ABDOMINAL HYSTERECTOMY    . CARPAL TUNNEL RELEASE Bilateral   . 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  Marland Kitchen KIDNEY STONE EXTRACTION    . KNEE ARTHROSCOPY WITH LATERAL MENISECTOMY Right 03/14/2017   Procedure: KNEE ARTHROSCOPY WITH LATERAL AND MEDIAL MENISECTOMY;  Surgeon: [name], MD;  Location: [facility];  Service: Orthopedics;  Laterality: Right;  . NECK SURGERY     plate and screws in neck   Social History    Occupational History  . Not on file  Tobacco Use  . Smoking status: Never Smoker  . Smokeless tobacco: Never Used  Vaping Use  . Vaping Use: Never used  Substance and Sexual Activity  . Alcohol use: No  . Drug use: No  . Sexual activity: Not Currently    Birth control/protection: Surgical

## 2020-09-02 DIAGNOSIS — Z1231 Encounter for screening mammogram for malignant neoplasm of breast: Secondary | ICD-10-CM | POA: Diagnosis not present

## 2020-09-14 DIAGNOSIS — F331 Major depressive disorder, recurrent, moderate: Secondary | ICD-10-CM | POA: Diagnosis not present

## 2020-10-25 DIAGNOSIS — Z0100 Encounter for examination of eyes and vision without abnormal findings: Secondary | ICD-10-CM | POA: Diagnosis not present

## 2020-10-25 DIAGNOSIS — I10 Essential (primary) hypertension: Secondary | ICD-10-CM | POA: Diagnosis not present

## 2020-10-25 DIAGNOSIS — H5200 Hypermetropia, unspecified eye: Secondary | ICD-10-CM | POA: Diagnosis not present

## 2020-10-25 DIAGNOSIS — E7800 Pure hypercholesterolemia, unspecified: Secondary | ICD-10-CM | POA: Diagnosis not present

## 2020-11-12 DIAGNOSIS — I10 Essential (primary) hypertension: Secondary | ICD-10-CM | POA: Diagnosis not present

## 2020-11-12 DIAGNOSIS — E7849 Other hyperlipidemia: Secondary | ICD-10-CM | POA: Diagnosis not present

## 2020-11-12 DIAGNOSIS — F331 Major depressive disorder, recurrent, moderate: Secondary | ICD-10-CM | POA: Diagnosis not present

## 2020-11-23 DIAGNOSIS — F331 Major depressive disorder, recurrent, moderate: Secondary | ICD-10-CM | POA: Diagnosis not present

## 2021-01-12 DIAGNOSIS — E7849 Other hyperlipidemia: Secondary | ICD-10-CM | POA: Diagnosis not present

## 2021-01-12 DIAGNOSIS — I10 Essential (primary) hypertension: Secondary | ICD-10-CM | POA: Diagnosis not present

## 2021-01-12 DIAGNOSIS — F331 Major depressive disorder, recurrent, moderate: Secondary | ICD-10-CM | POA: Diagnosis not present

## 2021-02-15 DIAGNOSIS — F331 Major depressive disorder, recurrent, moderate: Secondary | ICD-10-CM | POA: Diagnosis not present

## 2021-03-14 DIAGNOSIS — I10 Essential (primary) hypertension: Secondary | ICD-10-CM | POA: Diagnosis not present

## 2021-03-14 DIAGNOSIS — E7800 Pure hypercholesterolemia, unspecified: Secondary | ICD-10-CM | POA: Diagnosis not present

## 2021-03-14 DIAGNOSIS — Z1331 Encounter for screening for depression: Secondary | ICD-10-CM | POA: Diagnosis not present

## 2021-03-14 DIAGNOSIS — Z1339 Encounter for screening examination for other mental health and behavioral disorders: Secondary | ICD-10-CM | POA: Diagnosis not present

## 2021-03-14 DIAGNOSIS — Z299 Encounter for prophylactic measures, unspecified: Secondary | ICD-10-CM | POA: Diagnosis not present

## 2021-03-14 DIAGNOSIS — Z0000 Encounter for general adult medical examination without abnormal findings: Secondary | ICD-10-CM | POA: Diagnosis not present

## 2021-03-14 DIAGNOSIS — R5383 Other fatigue: Secondary | ICD-10-CM | POA: Diagnosis not present

## 2021-03-14 DIAGNOSIS — Z79899 Other long term (current) drug therapy: Secondary | ICD-10-CM | POA: Diagnosis not present

## 2021-03-14 DIAGNOSIS — Z789 Other specified health status: Secondary | ICD-10-CM | POA: Diagnosis not present

## 2021-03-14 DIAGNOSIS — F418 Other specified anxiety disorders: Secondary | ICD-10-CM | POA: Diagnosis not present

## 2021-03-14 DIAGNOSIS — Z7189 Other specified counseling: Secondary | ICD-10-CM | POA: Diagnosis not present

## 2021-03-14 DIAGNOSIS — Z6824 Body mass index (BMI) 24.0-24.9, adult: Secondary | ICD-10-CM | POA: Diagnosis not present

## 2021-03-15 DIAGNOSIS — F331 Major depressive disorder, recurrent, moderate: Secondary | ICD-10-CM | POA: Diagnosis not present

## 2021-03-15 DIAGNOSIS — I10 Essential (primary) hypertension: Secondary | ICD-10-CM | POA: Diagnosis not present

## 2021-03-15 DIAGNOSIS — E7849 Other hyperlipidemia: Secondary | ICD-10-CM | POA: Diagnosis not present

## 2021-04-14 DIAGNOSIS — T300 Burn of unspecified body region, unspecified degree: Secondary | ICD-10-CM | POA: Diagnosis not present

## 2021-04-14 DIAGNOSIS — R079 Chest pain, unspecified: Secondary | ICD-10-CM | POA: Diagnosis not present

## 2021-05-24 DIAGNOSIS — F331 Major depressive disorder, recurrent, moderate: Secondary | ICD-10-CM | POA: Diagnosis not present

## 2021-08-30 DIAGNOSIS — F331 Major depressive disorder, recurrent, moderate: Secondary | ICD-10-CM | POA: Diagnosis not present

## 2021-09-21 DIAGNOSIS — Z90711 Acquired absence of uterus with remaining cervical stump: Secondary | ICD-10-CM | POA: Diagnosis not present

## 2021-09-21 DIAGNOSIS — N958 Other specified menopausal and perimenopausal disorders: Secondary | ICD-10-CM | POA: Diagnosis not present

## 2021-09-21 DIAGNOSIS — Z01419 Encounter for gynecological examination (general) (routine) without abnormal findings: Secondary | ICD-10-CM | POA: Diagnosis not present

## 2021-10-12 DIAGNOSIS — Z6827 Body mass index (BMI) 27.0-27.9, adult: Secondary | ICD-10-CM | POA: Diagnosis not present

## 2021-10-12 DIAGNOSIS — Z299 Encounter for prophylactic measures, unspecified: Secondary | ICD-10-CM | POA: Diagnosis not present

## 2021-10-12 DIAGNOSIS — I10 Essential (primary) hypertension: Secondary | ICD-10-CM | POA: Diagnosis not present

## 2021-10-12 DIAGNOSIS — E7800 Pure hypercholesterolemia, unspecified: Secondary | ICD-10-CM | POA: Diagnosis not present

## 2021-10-12 DIAGNOSIS — F331 Major depressive disorder, recurrent, moderate: Secondary | ICD-10-CM | POA: Diagnosis not present

## 2021-12-13 DIAGNOSIS — E7849 Other hyperlipidemia: Secondary | ICD-10-CM | POA: Diagnosis not present

## 2021-12-13 DIAGNOSIS — I10 Essential (primary) hypertension: Secondary | ICD-10-CM | POA: Diagnosis not present

## 2021-12-13 DIAGNOSIS — F331 Major depressive disorder, recurrent, moderate: Secondary | ICD-10-CM | POA: Diagnosis not present

## 2021-12-19 DIAGNOSIS — Z1231 Encounter for screening mammogram for malignant neoplasm of breast: Secondary | ICD-10-CM | POA: Diagnosis not present

## 2021-12-27 DIAGNOSIS — F331 Major depressive disorder, recurrent, moderate: Secondary | ICD-10-CM | POA: Diagnosis not present

## 2022-03-15 DIAGNOSIS — E7849 Other hyperlipidemia: Secondary | ICD-10-CM | POA: Diagnosis not present

## 2022-03-15 DIAGNOSIS — F331 Major depressive disorder, recurrent, moderate: Secondary | ICD-10-CM | POA: Diagnosis not present

## 2022-03-15 DIAGNOSIS — I10 Essential (primary) hypertension: Secondary | ICD-10-CM | POA: Diagnosis not present

## 2022-03-16 DIAGNOSIS — R5383 Other fatigue: Secondary | ICD-10-CM | POA: Diagnosis not present

## 2022-03-16 DIAGNOSIS — Z1339 Encounter for screening examination for other mental health and behavioral disorders: Secondary | ICD-10-CM | POA: Diagnosis not present

## 2022-03-16 DIAGNOSIS — E7800 Pure hypercholesterolemia, unspecified: Secondary | ICD-10-CM | POA: Diagnosis not present

## 2022-03-16 DIAGNOSIS — Z0000 Encounter for general adult medical examination without abnormal findings: Secondary | ICD-10-CM | POA: Diagnosis not present

## 2022-03-16 DIAGNOSIS — M542 Cervicalgia: Secondary | ICD-10-CM | POA: Diagnosis not present

## 2022-03-16 DIAGNOSIS — Z299 Encounter for prophylactic measures, unspecified: Secondary | ICD-10-CM | POA: Diagnosis not present

## 2022-03-16 DIAGNOSIS — Z7189 Other specified counseling: Secondary | ICD-10-CM | POA: Diagnosis not present

## 2022-03-16 DIAGNOSIS — Z6827 Body mass index (BMI) 27.0-27.9, adult: Secondary | ICD-10-CM | POA: Diagnosis not present

## 2022-03-16 DIAGNOSIS — Z79899 Other long term (current) drug therapy: Secondary | ICD-10-CM | POA: Diagnosis not present

## 2022-03-16 DIAGNOSIS — I10 Essential (primary) hypertension: Secondary | ICD-10-CM | POA: Diagnosis not present

## 2022-03-16 DIAGNOSIS — Z1331 Encounter for screening for depression: Secondary | ICD-10-CM | POA: Diagnosis not present

## 2022-04-16 DIAGNOSIS — E2839 Other primary ovarian failure: Secondary | ICD-10-CM | POA: Diagnosis not present

## 2022-04-18 DIAGNOSIS — F331 Major depressive disorder, recurrent, moderate: Secondary | ICD-10-CM | POA: Diagnosis not present

## 2022-07-04 DIAGNOSIS — F331 Major depressive disorder, recurrent, moderate: Secondary | ICD-10-CM | POA: Diagnosis not present

## 2022-08-30 DIAGNOSIS — H40013 Open angle with borderline findings, low risk, bilateral: Secondary | ICD-10-CM | POA: Diagnosis not present

## 2022-08-30 DIAGNOSIS — H25813 Combined forms of age-related cataract, bilateral: Secondary | ICD-10-CM | POA: Diagnosis not present

## 2022-09-03 DIAGNOSIS — H25812 Combined forms of age-related cataract, left eye: Secondary | ICD-10-CM | POA: Diagnosis not present

## 2022-09-05 ENCOUNTER — Encounter (HOSPITAL_COMMUNITY): Payer: Self-pay

## 2022-09-05 ENCOUNTER — Encounter (HOSPITAL_COMMUNITY)
Admission: RE | Admit: 2022-09-05 | Discharge: 2022-09-05 | Disposition: A | Discharge status: Home / Self Care | Payer: Medicare HMO | Source: Ambulatory Visit | Attending: Ophthalmology | Admitting: Ophthalmology

## 2022-09-05 HISTORY — DX: Essential (primary) hypertension: I10
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
## 2022-09-10 ENCOUNTER — Encounter (HOSPITAL_COMMUNITY)
Admission: RE | Disposition: A | Discharge status: Home / Self Care | Payer: Self-pay | Source: Home / Self Care | Attending: Ophthalmology

## 2022-09-10 ENCOUNTER — Ambulatory Visit (HOSPITAL_BASED_OUTPATIENT_CLINIC_OR_DEPARTMENT_OTHER): Payer: Medicare HMO | Admitting: Anesthesiology

## 2022-09-10 ENCOUNTER — Encounter (HOSPITAL_COMMUNITY): Payer: Self-pay | Admitting: Ophthalmology

## 2022-09-10 ENCOUNTER — Ambulatory Visit (HOSPITAL_COMMUNITY): Payer: Medicare HMO | Admitting: Anesthesiology

## 2022-09-10 ENCOUNTER — Ambulatory Visit (HOSPITAL_COMMUNITY)
Admission: RE | Admit: 2022-09-10 | Discharge: 2022-09-10 | Disposition: A | Discharge status: Home / Self Care | Payer: Medicare HMO | Attending: Ophthalmology | Admitting: Ophthalmology

## 2022-09-10 DIAGNOSIS — H18413 Arcus senilis, bilateral: Secondary | ICD-10-CM | POA: Insufficient documentation

## 2022-09-10 DIAGNOSIS — I10 Essential (primary) hypertension: Secondary | ICD-10-CM

## 2022-09-10 DIAGNOSIS — H25812 Combined forms of age-related cataract, left eye: Secondary | ICD-10-CM

## 2022-09-10 DIAGNOSIS — H52222 Regular astigmatism, left eye: Secondary | ICD-10-CM | POA: Diagnosis not present

## 2022-09-10 DIAGNOSIS — H0100A Unspecified blepharitis right eye, upper and lower eyelids: Secondary | ICD-10-CM | POA: Diagnosis not present

## 2022-09-10 DIAGNOSIS — H40013 Open angle with borderline findings, low risk, bilateral: Secondary | ICD-10-CM | POA: Insufficient documentation

## 2022-09-10 DIAGNOSIS — H21511 Anterior synechiae (iris), right eye: Secondary | ICD-10-CM | POA: Diagnosis not present

## 2022-09-10 DIAGNOSIS — Z79899 Other long term (current) drug therapy: Secondary | ICD-10-CM | POA: Diagnosis not present

## 2022-09-10 DIAGNOSIS — F419 Anxiety disorder, unspecified: Secondary | ICD-10-CM | POA: Diagnosis not present

## 2022-09-10 DIAGNOSIS — H02834 Dermatochalasis of left upper eyelid: Secondary | ICD-10-CM | POA: Insufficient documentation

## 2022-09-10 DIAGNOSIS — H17821 Peripheral opacity of cornea, right eye: Secondary | ICD-10-CM | POA: Insufficient documentation

## 2022-09-10 DIAGNOSIS — H52211 Irregular astigmatism, right eye: Secondary | ICD-10-CM | POA: Diagnosis not present

## 2022-09-10 DIAGNOSIS — M1990 Unspecified osteoarthritis, unspecified site: Secondary | ICD-10-CM | POA: Diagnosis not present

## 2022-09-10 DIAGNOSIS — H02831 Dermatochalasis of right upper eyelid: Secondary | ICD-10-CM | POA: Diagnosis not present

## 2022-09-10 DIAGNOSIS — K219 Gastro-esophageal reflux disease without esophagitis: Secondary | ICD-10-CM | POA: Insufficient documentation

## 2022-09-10 DIAGNOSIS — H0100B Unspecified blepharitis left eye, upper and lower eyelids: Secondary | ICD-10-CM | POA: Diagnosis not present

## 2022-09-10 DIAGNOSIS — H25813 Combined forms of age-related cataract, bilateral: Secondary | ICD-10-CM | POA: Insufficient documentation

## 2022-09-10 DIAGNOSIS — F32A Depression, unspecified: Secondary | ICD-10-CM | POA: Diagnosis not present

## 2022-09-10 DIAGNOSIS — F418 Other specified anxiety disorders: Secondary | ICD-10-CM | POA: Diagnosis not present

## 2022-09-10 HISTORY — PX: CATARACT EXTRACTION W/PHACO: SHX586

## 2022-09-10 SURGERY — PHACOEMULSIFICATION, CATARACT, WITH IOL INSERTION
Anesthesia: Monitor Anesthesia Care | Site: Eye | Laterality: Left

## 2022-09-10 MED ORDER — BSS IO SOLN
INTRAOCULAR | Status: DC | PRN
  Administered 2022-09-10: 15 mL via INTRAOCULAR

## 2022-09-10 MED ORDER — SODIUM HYALURONATE 23MG/ML IO SOSY
PREFILLED_SYRINGE | INTRAOCULAR | Status: DC | PRN
  Administered 2022-09-10: .6 mL via INTRAOCULAR

## 2022-09-10 MED ORDER — PHENYLEPHRINE HCL 2.5 % OP SOLN
1.0000 [drp] | OPHTHALMIC | Status: AC | PRN
  Administered 2022-09-10 (×3): 1 [drp] via OPHTHALMIC

## 2022-09-10 MED ORDER — SODIUM CHLORIDE 0.9% FLUSH
INTRAVENOUS | Status: DC | PRN
  Administered 2022-09-10: 5 mL via INTRAVENOUS

## 2022-09-10 MED ORDER — MIDAZOLAM HCL 2 MG/2ML IJ SOLN
INTRAMUSCULAR | Status: DC | PRN
  Administered 2022-09-10: 2 mg via INTRAVENOUS

## 2022-09-10 MED ORDER — SODIUM HYALURONATE 10 MG/ML IO SOLUTION
PREFILLED_SYRINGE | INTRAOCULAR | Status: DC | PRN
  Administered 2022-09-10: .85 mL via INTRAOCULAR

## 2022-09-10 MED ORDER — POVIDONE-IODINE 5 % OP SOLN
OPHTHALMIC | Status: DC | PRN
  Administered 2022-09-10: 1 via OPHTHALMIC

## 2022-09-10 MED ORDER — LIDOCAINE HCL (PF) 1 % IJ SOLN
INTRAOCULAR | Status: DC | PRN
  Administered 2022-09-10: 1 mL via OPHTHALMIC

## 2022-09-10 MED ORDER — MIDAZOLAM HCL 2 MG/2ML IJ SOLN
INTRAMUSCULAR | Status: AC
  Filled 2022-09-10: qty 2

## 2022-09-10 MED ORDER — LIDOCAINE HCL 3.5 % OP GEL
1.0000 | Freq: Once | OPHTHALMIC | Status: AC
  Administered 2022-09-10: 1 via OPHTHALMIC

## 2022-09-10 MED ORDER — EPINEPHRINE PF 1 MG/ML IJ SOLN
INTRAMUSCULAR | Status: AC
  Filled 2022-09-10: qty 2

## 2022-09-10 MED ORDER — STERILE WATER FOR IRRIGATION IR SOLN
Status: DC | PRN
  Administered 2022-09-10: 25 mL

## 2022-09-10 MED ORDER — TETRACAINE HCL 0.5 % OP SOLN
1.0000 [drp] | OPHTHALMIC | Status: AC | PRN
  Administered 2022-09-10 (×3): 1 [drp] via OPHTHALMIC

## 2022-09-10 MED ORDER — PHENYLEPHRINE-KETOROLAC 1-0.3 % IO SOLN
INTRAOCULAR | Status: AC
  Filled 2022-09-10: qty 4

## 2022-09-10 MED ORDER — TROPICAMIDE 1 % OP SOLN
1.0000 [drp] | OPHTHALMIC | Status: AC | PRN
  Administered 2022-09-10 (×3): 1 [drp] via OPHTHALMIC

## 2022-09-10 MED ORDER — PHENYLEPHRINE-KETOROLAC 1-0.3 % IO SOLN
INTRAOCULAR | Status: DC | PRN
  Administered 2022-09-10: 500 mL via OPHTHALMIC

## 2022-09-10 MED ORDER — NEOMYCIN-POLYMYXIN-DEXAMETH 3.5-10000-0.1 OP SUSP
OPHTHALMIC | Status: DC | PRN
  Administered 2022-09-10: 2 [drp] via OPHTHALMIC

## 2022-09-10 SURGICAL SUPPLY — 14 items
CATARACT SUITE SIGHTPATH (MISCELLANEOUS) ×1 IMPLANT
CLOTH BEACON ORANGE TIMEOUT ST (SAFETY) ×1 IMPLANT
EYE SHIELD UNIVERSAL CLEAR (GAUZE/BANDAGES/DRESSINGS) IMPLANT
FEE CATARACT SUITE SIGHTPATH (MISCELLANEOUS) ×1 IMPLANT
GLOVE BIOGEL PI IND STRL 7.0 (GLOVE) ×2 IMPLANT
LENS IOL RAYNER 21.0 (Intraocular Lens) ×1 IMPLANT
LENS IOL RAYONE EMV 21.0 (Intraocular Lens) IMPLANT
NDL HYPO 18GX1.5 BLUNT FILL (NEEDLE) ×1 IMPLANT
NEEDLE HYPO 18GX1.5 BLUNT FILL (NEEDLE) ×1 IMPLANT
PAD ARMBOARD 7.5X6 YLW CONV (MISCELLANEOUS) ×1 IMPLANT
SYR TB 1ML LL NO SAFETY (SYRINGE) ×1 IMPLANT
TAPE SURG TRANSPORE 1 IN (GAUZE/BANDAGES/DRESSINGS) IMPLANT
TAPE SURGICAL TRANSPORE 1 IN (GAUZE/BANDAGES/DRESSINGS) ×1
WATER STERILE IRR 250ML POUR (IV SOLUTION) ×1 IMPLANT

## 2022-09-10 NOTE — Anesthesia Postprocedure Evaluation (Signed)
Anesthesia Post Note  Patient: SANTITA RIPPEON  Procedure(s) Performed: CATARACT EXTRACTION PHACO AND INTRAOCULAR LENS PLACEMENT (IOC) (Left: Eye)  Patient location during evaluation: Phase II Anesthesia Type: MAC Level of consciousness: awake and alert and oriented Pain management: pain level controlled Vital Signs Assessment: post-procedure vital signs reviewed and stable Respiratory status: spontaneous breathing, nonlabored ventilation and respiratory function stable Cardiovascular status: stable and blood pressure returned to baseline Postop Assessment: no apparent nausea or vomiting Anesthetic complications: no  No notable events documented.   Last Vitals:  Vitals:   09/10/22 0926 09/10/22 1020  BP: 139/83 133/79  Pulse: 90 80  Resp: 20 16  Temp: 36.9 C 36.8 C  SpO2: 98% 99%    Last Pain:  Vitals:   09/10/22 1020  TempSrc: Oral  PainSc: 0-No pain                 Angel Hobdy C Emanual Lamountain

## 2022-09-10 NOTE — Op Note (Signed)
Date of procedure: 09/10/22  Pre-operative diagnosis: Visually significant age-related combined cataract, Left Eye (H25.812)  Post-operative diagnosis: Visually significant age-related combined cataract, Left Eye (H25.812)  Procedure: Removal of cataract via phacoemulsification and insertion of intra-ocular lens Rayner [id number] +21.0D into the capsular bag of the Left Eye  Attending surgeon: Gerda Diss. Nasra Counce, MD, MA  Anesthesia: MAC, Topical Akten  Complications: None  Estimated Blood Loss: <33m (minimal)  Specimens: None  Implants: As above  Indications:  Visually significant age-related cataract, Left Eye  Procedure:  The patient was seen and identified in the pre-operative area. The operative eye was identified and dilated.  The operative eye was marked.  Topical anesthesia was administered to the operative eye.     The patient was then to the operative suite and placed in the supine position.  A timeout was performed confirming the patient, procedure to be performed, and all other relevant information.   The patient's face was prepped and draped in the usual fashion for intra-ocular surgery.  A lid speculum was placed into the operative eye and the surgical microscope moved into place and focused.  An inferotemporal paracentesis was created using a 20 gauge paracentesis blade.  Shugarcaine was injected into the anterior chamber.  Viscoelastic was injected into the anterior chamber.  A temporal clear-corneal main wound incision was created using a 2.429mmicrokeratome.  A continuous curvilinear capsulorrhexis was initiated using an irrigating cystitome and completed using capsulorrhexis forceps.  Hydrodissection and hydrodeliniation were performed.  Viscoelastic was injected into the anterior chamber.  A phacoemulsification handpiece and a chopper as a second instrument were used to remove the nucleus and epinucleus. The irrigation/aspiration handpiece was used to remove any remaining  cortical material.   The capsular bag was reinflated with viscoelastic, checked, and found to be intact.  The intraocular lens was inserted into the capsular bag.  The irrigation/aspiration handpiece was used to remove any remaining viscoelastic.  The clear corneal wound and paracentesis wounds were then hydrated and checked with Weck-Cels to be watertight. [id number] moxifloxacin was injected into the anterior chamber. The lid-speculum was removed.  The drape was removed.  The patient's face was cleaned with a wet and dry 4x4.    A clear shield was taped over the eye. The patient was taken to the post-operative care unit in good condition, having tolerated the procedure well.  Post-Op Instructions: The [facility]operative evaluation and will receive all other orders and instructions.

## 2022-09-10 NOTE — Transfer of Care (Signed)
Immediate Anesthesia Transfer of Care Note  Patient: Amber Cunningham  Procedure(s) Performed: CATARACT EXTRACTION PHACO AND INTRAOCULAR LENS PLACEMENT (IOC) (Left: Eye)  Patient Location: Short Stay  Anesthesia Type:MAC  Level of Consciousness: awake, alert , and oriented  Airway & Oxygen Therapy: Patient Spontanous Breathing  Post-op Assessment: Report given to RN and Post -op Vital signs reviewed and stable  Post vital signs: Reviewed and stable  Last Vitals:  Vitals Value Taken Time  BP 133/79 09/10/22 1020  Temp 36.8 C 09/10/22 1020  Pulse 80 09/10/22 1020  Resp 16 09/10/22 1020  SpO2 99 % 09/10/22 1020    Last Pain:  Vitals:   09/10/22 1020  TempSrc: Oral  PainSc: 0-No pain         Complications: No notable events documented.

## 2022-09-10 NOTE — Interval H&P Note (Signed)
History and Physical Interval Note:  09/10/2022 9:56 AM  Amber Cunningham  has presented today for surgery, with the diagnosis of combined forms age related cataract; left.  The various methods of treatment have been discussed with the patient and family. After consideration of risks, benefits and other options for treatment, the patient has consented to  Procedure(s) with comments: CATARACT EXTRACTION PHACO AND INTRAOCULAR LENS PLACEMENT (IOC) (Left) - CDE: as a surgical intervention.  The patient's history has been reviewed, patient examined, no change in status, stable for surgery.  I have reviewed the patient's chart and labs.  Questions were answered to the patient's satisfaction.     Baruch Goldmann

## 2022-09-10 NOTE — Anesthesia Preprocedure Evaluation (Signed)
Anesthesia Evaluation  Patient identified by MRN, date of birth, ID band Patient awake    Reviewed: Allergy & Precautions, H&P , NPO status , Patient's Chart, lab work & pertinent test results  Airway Mallampati: III  TM Distance: >3 FB Neck ROM: Full    Dental no notable dental hx. (+) Missing, Chipped   Pulmonary neg pulmonary ROS   Pulmonary exam normal breath sounds clear to auscultation       Cardiovascular hypertension, Pt. on medications Normal cardiovascular exam Rhythm:Regular Rate:Normal     Neuro/Psych  PSYCHIATRIC DISORDERS Anxiety Depression    negative neurological ROS     GI/Hepatic Neg liver ROS,GERD  Medicated,,  Endo/Other  negative endocrine ROS    Renal/GU negative Renal ROS  negative genitourinary   Musculoskeletal  (+) Arthritis , Osteoarthritis,    Abdominal   Peds negative pediatric ROS (+)  Hematology negative hematology ROS (+)   Anesthesia Other Findings   Reproductive/Obstetrics negative OB ROS                             Anesthesia Physical Anesthesia Plan  ASA: 2  Anesthesia Plan: MAC   Post-op Pain Management: Minimal or no pain anticipated   Induction:   PONV Risk Score and Plan: Treatment may vary due to age or medical condition  Airway Management Planned: Nasal Cannula and Natural Airway  Additional Equipment:   Intra-op Plan:   Post-operative Plan:   Informed Consent: I have reviewed the patients History and Physical, chart, labs and discussed the procedure including the risks, benefits and alternatives for the proposed anesthesia with the patient or authorized representative who has indicated his/her understanding and acceptance.     Dental advisory given  Plan Discussed with: CRNA and Surgeon  Anesthesia Plan Comments:        Anesthesia Quick Evaluation

## 2022-09-10 NOTE — Discharge Instructions (Addendum)
Please discharge patient when stable, will follow up today with Dr. Wrzosek at the [facility] [place] office immediately following discharge.  Leave shield in place until visit.  All paperwork with discharge instructions will be given at the office.  [facility] Address:  [address]  [place], [place] [address]  

## 2022-09-17 DIAGNOSIS — H21511 Anterior synechiae (iris), right eye: Secondary | ICD-10-CM | POA: Diagnosis not present

## 2022-09-18 ENCOUNTER — Encounter (HOSPITAL_COMMUNITY): Payer: Self-pay | Admitting: Ophthalmology

## 2022-09-18 ENCOUNTER — Encounter (HOSPITAL_COMMUNITY): Payer: Medicare HMO

## 2022-09-20 DIAGNOSIS — H25811 Combined forms of age-related cataract, right eye: Secondary | ICD-10-CM | POA: Diagnosis not present

## 2022-09-21 ENCOUNTER — Encounter (HOSPITAL_COMMUNITY)
Admission: RE | Admit: 2022-09-21 | Discharge: 2022-09-21 | Disposition: A | Discharge status: Home / Self Care | Payer: Medicare HMO | Source: Ambulatory Visit | Attending: Ophthalmology | Admitting: Ophthalmology

## 2022-09-24 ENCOUNTER — Other Ambulatory Visit: Payer: Self-pay

## 2022-09-24 ENCOUNTER — Encounter (HOSPITAL_COMMUNITY): Payer: Self-pay
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
## 2022-09-26 DIAGNOSIS — F331 Major depressive disorder, recurrent, moderate: Secondary | ICD-10-CM | POA: Diagnosis not present

## 2022-09-28 ENCOUNTER — Encounter (HOSPITAL_COMMUNITY): Payer: Self-pay | Admitting: Ophthalmology

## 2022-09-28 ENCOUNTER — Ambulatory Visit (HOSPITAL_BASED_OUTPATIENT_CLINIC_OR_DEPARTMENT_OTHER): Payer: Medicare HMO | Admitting: Anesthesiology

## 2022-09-28 ENCOUNTER — Encounter (HOSPITAL_COMMUNITY)
Admission: RE | Disposition: A | Discharge status: Home / Self Care | Payer: Self-pay | Source: Ambulatory Visit | Attending: Ophthalmology

## 2022-09-28 ENCOUNTER — Ambulatory Visit (HOSPITAL_COMMUNITY)
Admission: RE | Admit: 2022-09-28 | Discharge: 2022-09-28 | Disposition: A | Discharge status: Home / Self Care | Payer: Medicare HMO | Source: Ambulatory Visit | Attending: Ophthalmology | Admitting: Ophthalmology

## 2022-09-28 ENCOUNTER — Ambulatory Visit (HOSPITAL_COMMUNITY): Payer: Medicare HMO | Admitting: Anesthesiology

## 2022-09-28 DIAGNOSIS — F418 Other specified anxiety disorders: Secondary | ICD-10-CM | POA: Diagnosis not present

## 2022-09-28 DIAGNOSIS — I10 Essential (primary) hypertension: Secondary | ICD-10-CM | POA: Insufficient documentation

## 2022-09-28 DIAGNOSIS — M1990 Unspecified osteoarthritis, unspecified site: Secondary | ICD-10-CM

## 2022-09-28 DIAGNOSIS — H25811 Combined forms of age-related cataract, right eye: Secondary | ICD-10-CM

## 2022-09-28 DIAGNOSIS — K219 Gastro-esophageal reflux disease without esophagitis: Secondary | ICD-10-CM | POA: Diagnosis not present

## 2022-09-28 DIAGNOSIS — Z9842 Cataract extraction status, left eye: Secondary | ICD-10-CM | POA: Diagnosis not present

## 2022-09-28 DIAGNOSIS — H21511 Anterior synechiae (iris), right eye: Secondary | ICD-10-CM | POA: Insufficient documentation

## 2022-09-28 HISTORY — PX: CATARACT EXTRACTION W/PHACO: SHX586

## 2022-09-28 SURGERY — PHACOEMULSIFICATION, CATARACT, WITH IOL INSERTION
Anesthesia: Monitor Anesthesia Care | Site: Eye | Laterality: Right

## 2022-09-28 MED ORDER — MIDAZOLAM HCL 2 MG/2ML IJ SOLN
INTRAMUSCULAR | Status: AC
  Filled 2022-09-28: qty 2

## 2022-09-28 MED ORDER — LIDOCAINE HCL 3.5 % OP GEL
1.0000 | Freq: Once | OPHTHALMIC | Status: AC
  Administered 2022-09-28: 1 via OPHTHALMIC

## 2022-09-28 MED ORDER — TROPICAMIDE 1 % OP SOLN
1.0000 [drp] | OPHTHALMIC | Status: AC | PRN
  Administered 2022-09-28 (×3): 1 [drp] via OPHTHALMIC

## 2022-09-28 MED ORDER — SODIUM HYALURONATE 23MG/ML IO SOSY
PREFILLED_SYRINGE | INTRAOCULAR | Status: DC | PRN
  Administered 2022-09-28: .6 mL via INTRAOCULAR

## 2022-09-28 MED ORDER — EPINEPHRINE PF 1 MG/ML IJ SOLN
INTRAMUSCULAR | Status: AC
  Filled 2022-09-28: qty 2

## 2022-09-28 MED ORDER — LIDOCAINE HCL (PF) 1 % IJ SOLN
INTRAOCULAR | Status: DC | PRN
  Administered 2022-09-28: 1 mL via OPHTHALMIC

## 2022-09-28 MED ORDER — PHENYLEPHRINE HCL 2.5 % OP SOLN
1.0000 [drp] | OPHTHALMIC | Status: AC | PRN
  Administered 2022-09-28 (×3): 1 [drp] via OPHTHALMIC

## 2022-09-28 MED ORDER — NEOMYCIN-POLYMYXIN-DEXAMETH 3.5-10000-0.1 OP SUSP
OPHTHALMIC | Status: DC | PRN
  Administered 2022-09-28: 2 [drp] via OPHTHALMIC

## 2022-09-28 MED ORDER — SODIUM HYALURONATE 10 MG/ML IO SOLUTION
PREFILLED_SYRINGE | INTRAOCULAR | Status: DC | PRN
  Administered 2022-09-28: .85 mL via INTRAOCULAR

## 2022-09-28 MED ORDER — POVIDONE-IODINE 5 % OP SOLN
OPHTHALMIC | Status: DC | PRN
  Administered 2022-09-28: 1 via OPHTHALMIC

## 2022-09-28 MED ORDER — TETRACAINE HCL 0.5 % OP SOLN
1.0000 [drp] | OPHTHALMIC | Status: AC | PRN
  Administered 2022-09-28 (×3): 1 [drp] via OPHTHALMIC

## 2022-09-28 MED ORDER — STERILE WATER FOR IRRIGATION IR SOLN
Status: DC | PRN
  Administered 2022-09-28: 25 mL

## 2022-09-28 MED ORDER — LIDOCAINE HCL (PF) 1 % IJ SOLN
INTRAMUSCULAR | Status: AC
  Filled 2022-09-28: qty 2

## 2022-09-28 MED ORDER — BSS IO SOLN
INTRAOCULAR | Status: DC | PRN
  Administered 2022-09-28: 15 mL via INTRAOCULAR

## 2022-09-28 MED ORDER — SODIUM CHLORIDE (PF) 0.9 % IJ SOLN
INTRAMUSCULAR | Status: AC
  Filled 2022-09-28: qty 20

## 2022-09-28 MED ORDER — MIDAZOLAM HCL 2 MG/2ML IJ SOLN
INTRAMUSCULAR | Status: DC | PRN
  Administered 2022-09-28: 2 mg via INTRAVENOUS

## 2022-09-28 MED ORDER — PHENYLEPHRINE-KETOROLAC 1-0.3 % IO SOLN
INTRAOCULAR | Status: AC
  Filled 2022-09-28: qty 4

## 2022-09-28 MED ORDER — PHENYLEPHRINE-KETOROLAC 1-0.3 % IO SOLN
INTRAOCULAR | Status: DC | PRN
  Administered 2022-09-28: 500 mL via OPHTHALMIC

## 2022-09-28 SURGICAL SUPPLY — 14 items
CATARACT SUITE SIGHTPATH (MISCELLANEOUS) ×1 IMPLANT
CLOTH BEACON ORANGE TIMEOUT ST (SAFETY) ×1 IMPLANT
EYE SHIELD UNIVERSAL CLEAR (GAUZE/BANDAGES/DRESSINGS) IMPLANT
FEE CATARACT SUITE SIGHTPATH (MISCELLANEOUS) ×1 IMPLANT
GLOVE BIOGEL PI IND STRL 7.0 (GLOVE) ×2 IMPLANT
LENS IOL RAYNER 24.5 (Intraocular Lens) ×1 IMPLANT
LENS IOL RAYONE EMV 24.5 (Intraocular Lens) IMPLANT
NDL HYPO 18GX1.5 BLUNT FILL (NEEDLE) ×1 IMPLANT
NEEDLE HYPO 18GX1.5 BLUNT FILL (NEEDLE) ×1 IMPLANT
PAD ARMBOARD 7.5X6 YLW CONV (MISCELLANEOUS) ×1 IMPLANT
SYR TB 1ML LL NO SAFETY (SYRINGE) ×1 IMPLANT
TAPE SURG TRANSPORE 1 IN (GAUZE/BANDAGES/DRESSINGS) IMPLANT
TAPE SURGICAL TRANSPORE 1 IN (GAUZE/BANDAGES/DRESSINGS) ×1
WATER STERILE IRR 250ML POUR (IV SOLUTION) ×1 IMPLANT

## 2022-09-28 NOTE — Interval H&P Note (Signed)
History and Physical Interval Note:  09/28/2022 8:50 AM  Amber Cunningham  has presented today for surgery, with the diagnosis of combined forms age related cataract; right.  The various methods of treatment have been discussed with the patient and family. After consideration of risks, benefits and other options for treatment, the patient has consented to  Procedure(s) with comments: CATARACT EXTRACTION PHACO AND INTRAOCULAR LENS PLACEMENT (IOC) (Right) - CDE: as a surgical intervention.  The patient's history has been reviewed, patient examined, no change in status, stable for surgery.  I have reviewed the patient's chart and labs.  Questions were answered to the patient's satisfaction.     Baruch Goldmann

## 2022-09-28 NOTE — Transfer of Care (Signed)
Immediate Anesthesia Transfer of Care Note  Patient: Amber Cunningham  Procedure(s) Performed: CATARACT EXTRACTION PHACO AND INTRAOCULAR LENS PLACEMENT (IOC) (Right: Eye)  Patient Location: Short Stay  Anesthesia Type:MAC  Level of Consciousness: awake, alert , oriented, and patient cooperative  Airway & Oxygen Therapy: Patient Spontanous Breathing  Post-op Assessment: Report given to RN, Post -op Vital signs reviewed and stable, and Patient moving all extremities  Post vital signs: Reviewed and stable  Last Vitals:  Vitals Value Taken Time  BP    Temp    Pulse    Resp    SpO2      Last Pain: There were no vitals filed for this visit.       Complications: No notable events documented.

## 2022-09-28 NOTE — Discharge Instructions (Signed)
Please discharge patient when stable, will follow up today with Dr. Issabela Lesko at the [facility] [place] office immediately following discharge.  Leave shield in place until visit.  All paperwork with discharge instructions will be given at the office.  [facility] Address:  [address]  , [place] [address]  

## 2022-09-28 NOTE — Op Note (Signed)
Date of procedure: 09/28/22  Pre-operative diagnosis:  Visually significant combined form age-related cataract, Right Eye (H25.811)  Post-operative diagnosis:  Visually significant combined form age-related cataract, Right Eye (H25.811)  Procedure: Removal of cataract via phacoemulsification and insertion of intra-ocular lens Rayner [id number] +24.5D into the capsular bag of the Right Eye  Attending surgeon: Gerda Diss. Cindel Daugherty, MD, MA  Anesthesia: MAC, Topical Akten  Complications: None  Estimated Blood Loss: <37mL (minimal)  Specimens: None  Implants: As above  Indications:  Visually significant age-related cataract, Right Eye  Procedure:  The patient was seen and identified in the pre-operative area. The operative eye was identified and dilated.  The operative eye was marked.  Topical anesthesia was administered to the operative eye.     The patient was then to the operative suite and placed in the supine position.  A timeout was performed confirming the patient, procedure to be performed, and all other relevant information.   The patient's face was prepped and draped in the usual fashion for intra-ocular surgery.  A lid speculum was placed into the operative eye and the surgical microscope moved into place and focused.  A superotemporal paracentesis was created using a 20 gauge paracentesis blade.  Shugarcaine was injected into the anterior chamber.  Viscoelastic was injected into the anterior chamber.  A temporal clear-corneal main wound incision was created using a 2.63mm microkeratome.  A continuous curvilinear capsulorrhexis was initiated using an irrigating cystitome and completed using capsulorrhexis forceps.  Hydrodissection and hydrodeliniation were performed.  Viscoelastic was injected into the anterior chamber.  A phacoemulsification handpiece and a chopper as a second instrument were used to remove the nucleus and epinucleus. The irrigation/aspiration handpiece was used to remove any  remaining cortical material.   The capsular bag was reinflated with viscoelastic, checked, and found to be intact.  The intraocular lens was inserted into the capsular bag.  The irrigation/aspiration handpiece was used to remove any remaining viscoelastic.  The clear corneal wound and paracentesis wounds were then hydrated and checked with Weck-Cels to be watertight. Maxitrol drops were instilled into the operative eye.  The lid-speculum was removed.  The drape was removed.  The patient's face was cleaned with a wet and dry 4x4. A clear shield was taped over the eye. The patient was taken to the post-operative care unit in good condition, having tolerated the procedure well.  Post-Op Instructions: The patient will follow up at [facility] for a same day post-operative evaluation and will receive all other orders and instructions.

## 2022-09-28 NOTE — Anesthesia Preprocedure Evaluation (Signed)
Anesthesia Evaluation  Patient identified by MRN, date of birth, ID band Patient awake    Reviewed: Allergy & Precautions, H&P , NPO status , Patient's Chart, lab work & pertinent test results  Airway Mallampati: III  TM Distance: >3 FB Neck ROM: Full    Dental no notable dental hx. (+) Missing, Chipped   Pulmonary neg pulmonary ROS   Pulmonary exam normal breath sounds clear to auscultation       Cardiovascular hypertension, Pt. on medications Normal cardiovascular exam Rhythm:Regular Rate:Normal     Neuro/Psych  PSYCHIATRIC DISORDERS Anxiety Depression    negative neurological ROS     GI/Hepatic Neg liver ROS,GERD  Medicated,,  Endo/Other  negative endocrine ROS    Renal/GU negative Renal ROS  negative genitourinary   Musculoskeletal  (+) Arthritis , Osteoarthritis,    Abdominal   Peds negative pediatric ROS (+)  Hematology negative hematology ROS (+)   Anesthesia Other Findings   Reproductive/Obstetrics negative OB ROS                             Anesthesia Physical Anesthesia Plan  ASA: 2  Anesthesia Plan: MAC   Post-op Pain Management: Minimal or no pain anticipated   Induction:   PONV Risk Score and Plan: Treatment may vary due to age or medical condition  Airway Management Planned: Nasal Cannula and Natural Airway  Additional Equipment:   Intra-op Plan:   Post-operative Plan:   Informed Consent: I have reviewed the patients History and Physical, chart, labs and discussed the procedure including the risks, benefits and alternatives for the proposed anesthesia with the patient or authorized representative who has indicated his/her understanding and acceptance.     Dental advisory given  Plan Discussed with: CRNA and Surgeon  Anesthesia Plan Comments:        Anesthesia Quick Evaluation  

## 2022-09-29 NOTE — Anesthesia Postprocedure Evaluation (Signed)
Anesthesia Post Note  Patient: Amber Cunningham  Procedure(s) Performed: CATARACT EXTRACTION PHACO AND INTRAOCULAR LENS PLACEMENT (IOC) (Right: Eye)  Patient location during evaluation: Phase II Anesthesia Type: MAC Level of consciousness: awake Pain management: pain level controlled Vital Signs Assessment: post-procedure vital signs reviewed and stable Respiratory status: spontaneous breathing and respiratory function stable Cardiovascular status: blood pressure returned to baseline and stable Postop Assessment: no headache and no apparent nausea or vomiting Anesthetic complications: no Comments: Late entry   No notable events documented.   Last Vitals:  Vitals:   09/28/22 0926  BP: 131/73  Pulse: 81  Resp: 16  Temp: 36.7 C  SpO2: 99%    Last Pain:  Vitals:   09/28/22 0926  TempSrc: Oral  PainSc: 0-No pain                 Louann Sjogren

## 2022-10-09 ENCOUNTER — Encounter (HOSPITAL_COMMUNITY): Payer: Self-pay | Admitting: Ophthalmology

## 2022-10-23 DIAGNOSIS — Z90711 Acquired absence of uterus with remaining cervical stump: Secondary | ICD-10-CM | POA: Diagnosis not present

## 2022-10-23 DIAGNOSIS — Z79890 Hormone replacement therapy (postmenopausal): Secondary | ICD-10-CM | POA: Diagnosis not present

## 2022-10-23 DIAGNOSIS — Z113 Encounter for screening for infections with a predominantly sexual mode of transmission: Secondary | ICD-10-CM | POA: Diagnosis not present

## 2022-11-02 DIAGNOSIS — H524 Presbyopia: Secondary | ICD-10-CM | POA: Diagnosis not present

## 2022-12-19 DIAGNOSIS — F331 Major depressive disorder, recurrent, moderate: Secondary | ICD-10-CM | POA: Diagnosis not present

## 2022-12-28 DIAGNOSIS — Z79899 Other long term (current) drug therapy: Secondary | ICD-10-CM | POA: Diagnosis not present

## 2022-12-28 DIAGNOSIS — Z1339 Encounter for screening examination for other mental health and behavioral disorders: Secondary | ICD-10-CM | POA: Diagnosis not present

## 2022-12-28 DIAGNOSIS — R5383 Other fatigue: Secondary | ICD-10-CM | POA: Diagnosis not present

## 2022-12-28 DIAGNOSIS — F419 Anxiety disorder, unspecified: Secondary | ICD-10-CM | POA: Diagnosis not present

## 2022-12-28 DIAGNOSIS — Z0000 Encounter for general adult medical examination without abnormal findings: Secondary | ICD-10-CM | POA: Diagnosis not present

## 2022-12-28 DIAGNOSIS — Z1331 Encounter for screening for depression: Secondary | ICD-10-CM | POA: Diagnosis not present

## 2022-12-28 DIAGNOSIS — E8940 Asymptomatic postprocedural ovarian failure: Secondary | ICD-10-CM | POA: Diagnosis not present

## 2022-12-28 DIAGNOSIS — F418 Other specified anxiety disorders: Secondary | ICD-10-CM | POA: Diagnosis not present

## 2022-12-28 DIAGNOSIS — Z7189 Other specified counseling: Secondary | ICD-10-CM | POA: Diagnosis not present

## 2022-12-28 DIAGNOSIS — Z299 Encounter for prophylactic measures, unspecified: Secondary | ICD-10-CM | POA: Diagnosis not present

## 2022-12-28 DIAGNOSIS — E7800 Pure hypercholesterolemia, unspecified: Secondary | ICD-10-CM | POA: Diagnosis not present

## 2022-12-28 DIAGNOSIS — F331 Major depressive disorder, recurrent, moderate: Secondary | ICD-10-CM | POA: Diagnosis not present

## 2022-12-28 DIAGNOSIS — I10 Essential (primary) hypertension: Secondary | ICD-10-CM | POA: Diagnosis not present

## 2023-01-09 DIAGNOSIS — Z1212 Encounter for screening for malignant neoplasm of rectum: Secondary | ICD-10-CM | POA: Diagnosis not present

## 2023-01-09 DIAGNOSIS — Z1211 Encounter for screening for malignant neoplasm of colon: Secondary | ICD-10-CM | POA: Diagnosis not present

## 2023-01-21 DIAGNOSIS — Z1231 Encounter for screening mammogram for malignant neoplasm of breast: Secondary | ICD-10-CM | POA: Diagnosis not present

## 2023-02-07 DIAGNOSIS — Z800 Family history of malignant neoplasm of digestive organs: Secondary | ICD-10-CM | POA: Diagnosis not present

## 2023-02-07 DIAGNOSIS — R195 Other fecal abnormalities: Secondary | ICD-10-CM | POA: Diagnosis not present

## 2023-02-07 DIAGNOSIS — R634 Abnormal weight loss: Secondary | ICD-10-CM | POA: Diagnosis not present

## 2023-02-25 DIAGNOSIS — K635 Polyp of colon: Secondary | ICD-10-CM | POA: Diagnosis not present

## 2023-02-25 DIAGNOSIS — K219 Gastro-esophageal reflux disease without esophagitis: Secondary | ICD-10-CM | POA: Diagnosis not present

## 2023-02-25 DIAGNOSIS — I10 Essential (primary) hypertension: Secondary | ICD-10-CM | POA: Diagnosis not present

## 2023-02-25 DIAGNOSIS — R195 Other fecal abnormalities: Secondary | ICD-10-CM | POA: Diagnosis not present

## 2023-02-25 DIAGNOSIS — K5730 Diverticulosis of large intestine without perforation or abscess without bleeding: Secondary | ICD-10-CM | POA: Diagnosis not present

## 2023-02-25 DIAGNOSIS — K644 Residual hemorrhoidal skin tags: Secondary | ICD-10-CM | POA: Diagnosis not present

## 2023-02-25 DIAGNOSIS — F419 Anxiety disorder, unspecified: Secondary | ICD-10-CM | POA: Diagnosis not present

## 2023-02-25 DIAGNOSIS — Z1211 Encounter for screening for malignant neoplasm of colon: Secondary | ICD-10-CM | POA: Diagnosis not present

## 2023-02-25 DIAGNOSIS — R634 Abnormal weight loss: Secondary | ICD-10-CM | POA: Diagnosis not present

## 2023-02-25 DIAGNOSIS — D122 Benign neoplasm of ascending colon: Secondary | ICD-10-CM | POA: Diagnosis not present

## 2023-03-12 DIAGNOSIS — R634 Abnormal weight loss: Secondary | ICD-10-CM | POA: Diagnosis not present

## 2023-03-12 DIAGNOSIS — R195 Other fecal abnormalities: Secondary | ICD-10-CM | POA: Diagnosis not present

## 2023-03-12 DIAGNOSIS — Z800 Family history of malignant neoplasm of digestive organs: Secondary | ICD-10-CM | POA: Diagnosis not present

## 2023-03-13 DIAGNOSIS — F331 Major depressive disorder, recurrent, moderate: Secondary | ICD-10-CM | POA: Diagnosis not present

## 2023-03-22 DIAGNOSIS — Z0000 Encounter for general adult medical examination without abnormal findings: Secondary | ICD-10-CM | POA: Diagnosis not present

## 2023-03-22 DIAGNOSIS — I10 Essential (primary) hypertension: Secondary | ICD-10-CM | POA: Diagnosis not present

## 2023-03-22 DIAGNOSIS — F331 Major depressive disorder, recurrent, moderate: Secondary | ICD-10-CM | POA: Diagnosis not present

## 2023-03-22 DIAGNOSIS — Z299 Encounter for prophylactic measures, unspecified: Secondary | ICD-10-CM | POA: Diagnosis not present

## 2023-05-28 DIAGNOSIS — Z90711 Acquired absence of uterus with remaining cervical stump: Secondary | ICD-10-CM | POA: Diagnosis not present

## 2023-05-28 DIAGNOSIS — Z79890 Hormone replacement therapy (postmenopausal): Secondary | ICD-10-CM | POA: Diagnosis not present

## 2023-05-28 DIAGNOSIS — Z01419 Encounter for gynecological examination (general) (routine) without abnormal findings: Secondary | ICD-10-CM | POA: Diagnosis not present

## 2023-08-14 DIAGNOSIS — F331 Major depressive disorder, recurrent, moderate: Secondary | ICD-10-CM | POA: Diagnosis not present

## 2023-11-26 DIAGNOSIS — Z09 Encounter for follow-up examination after completed treatment for conditions other than malignant neoplasm: Secondary | ICD-10-CM | POA: Diagnosis not present

## 2023-11-26 DIAGNOSIS — Z90711 Acquired absence of uterus with remaining cervical stump: Secondary | ICD-10-CM | POA: Diagnosis not present

## 2023-11-26 DIAGNOSIS — Z79890 Hormone replacement therapy (postmenopausal): Secondary | ICD-10-CM | POA: Diagnosis not present

## 2023-12-04 DIAGNOSIS — F331 Major depressive disorder, recurrent, moderate: Secondary | ICD-10-CM | POA: Diagnosis not present

## 2023-12-30 DIAGNOSIS — I10 Essential (primary) hypertension: Secondary | ICD-10-CM | POA: Diagnosis not present

## 2023-12-30 DIAGNOSIS — Z1331 Encounter for screening for depression: Secondary | ICD-10-CM | POA: Diagnosis not present

## 2023-12-30 DIAGNOSIS — Z0000 Encounter for general adult medical examination without abnormal findings: Secondary | ICD-10-CM | POA: Diagnosis not present

## 2023-12-30 DIAGNOSIS — Z1339 Encounter for screening examination for other mental health and behavioral disorders: Secondary | ICD-10-CM | POA: Diagnosis not present

## 2023-12-30 DIAGNOSIS — Z79899 Other long term (current) drug therapy: Secondary | ICD-10-CM | POA: Diagnosis not present

## 2023-12-30 DIAGNOSIS — R5383 Other fatigue: Secondary | ICD-10-CM | POA: Diagnosis not present

## 2023-12-30 DIAGNOSIS — E7800 Pure hypercholesterolemia, unspecified: Secondary | ICD-10-CM | POA: Diagnosis not present

## 2023-12-30 DIAGNOSIS — Z299 Encounter for prophylactic measures, unspecified: Secondary | ICD-10-CM | POA: Diagnosis not present

## 2023-12-30 DIAGNOSIS — Z7189 Other specified counseling: Secondary | ICD-10-CM | POA: Diagnosis not present

## 2023-12-30 DIAGNOSIS — F331 Major depressive disorder, recurrent, moderate: Secondary | ICD-10-CM | POA: Diagnosis not present

## 2024-01-01 DIAGNOSIS — Z961 Presence of intraocular lens: Secondary | ICD-10-CM | POA: Diagnosis not present

## 2024-01-01 DIAGNOSIS — H524 Presbyopia: Secondary | ICD-10-CM | POA: Diagnosis not present

## 2024-01-01 DIAGNOSIS — H52223 Regular astigmatism, bilateral: Secondary | ICD-10-CM | POA: Diagnosis not present

## 2024-01-01 DIAGNOSIS — H5201 Hypermetropia, right eye: Secondary | ICD-10-CM | POA: Diagnosis not present

## 2024-02-26 DIAGNOSIS — Z1231 Encounter for screening mammogram for malignant neoplasm of breast: Secondary | ICD-10-CM | POA: Diagnosis not present

## 2024-03-25 DIAGNOSIS — F419 Anxiety disorder, unspecified: Secondary | ICD-10-CM | POA: Diagnosis not present

## 2024-04-09 DIAGNOSIS — F331 Major depressive disorder, recurrent, moderate: Secondary | ICD-10-CM | POA: Diagnosis not present

## 2024-04-09 DIAGNOSIS — Z23 Encounter for immunization: Secondary | ICD-10-CM | POA: Diagnosis not present

## 2024-04-09 DIAGNOSIS — I10 Essential (primary) hypertension: Secondary | ICD-10-CM | POA: Diagnosis not present

## 2024-04-09 DIAGNOSIS — Z0000 Encounter for general adult medical examination without abnormal findings: Secondary | ICD-10-CM | POA: Diagnosis not present

## 2024-04-09 DIAGNOSIS — Z789 Other specified health status: Secondary | ICD-10-CM | POA: Diagnosis not present

## 2024-04-09 DIAGNOSIS — Z299 Encounter for prophylactic measures, unspecified: Secondary | ICD-10-CM | POA: Diagnosis not present

## 2024-04-24 DIAGNOSIS — E2839 Other primary ovarian failure: Secondary | ICD-10-CM | POA: Diagnosis not present

## 2024-05-28 DIAGNOSIS — Z01419 Encounter for gynecological examination (general) (routine) without abnormal findings: Secondary | ICD-10-CM | POA: Diagnosis not present

## 2024-05-28 DIAGNOSIS — Z79890 Hormone replacement therapy (postmenopausal): Secondary | ICD-10-CM | POA: Diagnosis not present

## 2024-05-28 DIAGNOSIS — Z90711 Acquired absence of uterus with remaining cervical stump: Secondary | ICD-10-CM | POA: Diagnosis not present
